# Patient Record
Sex: Female | Born: 1980 | Race: Black or African American | Hispanic: No | Marital: Single | Smoking: Former smoker
Health system: Southern US, Community
[De-identification: ages and names within clinical notes are randomized; demographics above are authoritative.]

## PROBLEM LIST (undated history)

## (undated) DIAGNOSIS — T7840XA Allergy, unspecified, initial encounter: Secondary | ICD-10-CM

## (undated) DIAGNOSIS — R102 Pelvic and perineal pain: Secondary | ICD-10-CM

## (undated) DIAGNOSIS — Z9889 Other specified postprocedural states: Secondary | ICD-10-CM

## (undated) DIAGNOSIS — F329 Major depressive disorder, single episode, unspecified: Secondary | ICD-10-CM

## (undated) DIAGNOSIS — D563 Thalassemia minor: Secondary | ICD-10-CM

## (undated) DIAGNOSIS — R112 Nausea with vomiting, unspecified: Secondary | ICD-10-CM

## (undated) DIAGNOSIS — Z973 Presence of spectacles and contact lenses: Secondary | ICD-10-CM

## (undated) DIAGNOSIS — F419 Anxiety disorder, unspecified: Secondary | ICD-10-CM

## (undated) DIAGNOSIS — Z87410 Personal history of cervical dysplasia: Secondary | ICD-10-CM

## (undated) DIAGNOSIS — B009 Herpesviral infection, unspecified: Secondary | ICD-10-CM

## (undated) DIAGNOSIS — F32A Depression, unspecified: Secondary | ICD-10-CM

## (undated) HISTORY — DX: Anxiety disorder, unspecified: F41.9

## (undated) HISTORY — DX: Allergy, unspecified, initial encounter: T78.40XA

## (undated) HISTORY — PX: ABDOMINAL HYSTERECTOMY: SHX81

## (undated) SURGERY — Surgical Case
Anesthesia: *Unknown

---
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
## 1999-02-08 ENCOUNTER — Other Ambulatory Visit: Admission: RE | Admit: 1999-02-08 | Discharge: 1999-02-08 | Payer: Self-pay | Admitting: Obstetrics and Gynecology

## 1999-06-06 ENCOUNTER — Other Ambulatory Visit: Admission: RE | Admit: 1999-06-06 | Discharge: 1999-06-06 | Payer: Self-pay | Admitting: Obstetrics & Gynecology

## 1999-11-07 ENCOUNTER — Other Ambulatory Visit: Admission: RE | Admit: 1999-11-07 | Discharge: 1999-11-07 | Payer: Self-pay | Admitting: Obstetrics & Gynecology

## 2000-05-28 ENCOUNTER — Other Ambulatory Visit: Admission: RE | Admit: 2000-05-28 | Discharge: 2000-05-28 | Payer: Self-pay | Admitting: Obstetrics & Gynecology

## 2000-11-03 HISTORY — PX: LAPAROSCOPY: SHX197

## 2000-11-11 ENCOUNTER — Ambulatory Visit (HOSPITAL_COMMUNITY): Admission: RE | Admit: 2000-11-11 | Discharge: 2000-11-11 | Payer: Self-pay | Admitting: Obstetrics and Gynecology

## 2001-11-16 ENCOUNTER — Other Ambulatory Visit: Admission: RE | Admit: 2001-11-16 | Discharge: 2001-11-16 | Payer: Self-pay | Admitting: Obstetrics and Gynecology

## 2011-12-15 ENCOUNTER — Telehealth: Payer: Self-pay | Admitting: Hematology and Oncology

## 2011-12-15 NOTE — Telephone Encounter (Signed)
lmonvm for pt re calling me for appt w/LO 

## 2011-12-16 ENCOUNTER — Telehealth: Payer: Self-pay | Admitting: Hematology and Oncology

## 2011-12-16 NOTE — Telephone Encounter (Signed)
Pt returned my call and was given new pt appt for 2/15 @ 9:30 am w/Elizabeth Benson.

## 2011-12-16 NOTE — Telephone Encounter (Signed)
lmonvm for pt re calling me for appt w/LO 

## 2011-12-17 ENCOUNTER — Telehealth: Payer: Self-pay | Admitting: Hematology and Oncology

## 2011-12-17 NOTE — Telephone Encounter (Signed)
Referred by Dr. Dorothyann Peng Dx- Abn Labs

## 2011-12-19 ENCOUNTER — Telehealth: Payer: Self-pay | Admitting: Hematology and Oncology

## 2011-12-19 ENCOUNTER — Ambulatory Visit (HOSPITAL_BASED_OUTPATIENT_CLINIC_OR_DEPARTMENT_OTHER): Payer: PRIVATE HEALTH INSURANCE | Admitting: Hematology and Oncology

## 2011-12-19 ENCOUNTER — Ambulatory Visit: Payer: PRIVATE HEALTH INSURANCE | Admitting: Lab

## 2011-12-19 ENCOUNTER — Ambulatory Visit: Payer: PRIVATE HEALTH INSURANCE

## 2011-12-19 ENCOUNTER — Encounter: Payer: Self-pay | Admitting: Hematology and Oncology

## 2011-12-19 DIAGNOSIS — D649 Anemia, unspecified: Secondary | ICD-10-CM

## 2011-12-19 DIAGNOSIS — D563 Thalassemia minor: Secondary | ICD-10-CM

## 2011-12-19 LAB — CBC WITH DIFFERENTIAL/PLATELET
BASO%: 0.5 % (ref 0.0–2.0)
LYMPH%: 43.7 % (ref 14.0–49.7)
MCH: 23.1 pg — ABNORMAL LOW (ref 25.1–34.0)
MCHC: 32.3 g/dL (ref 31.5–36.0)
MCV: 71.7 fL — ABNORMAL LOW (ref 79.5–101.0)
MONO%: 5.6 % (ref 0.0–14.0)
Platelets: 308 10*3/uL (ref 145–400)
RBC: 5.28 10*6/uL (ref 3.70–5.45)

## 2011-12-19 LAB — FERRITIN: Ferritin: 38 ng/mL (ref 10–291)

## 2011-12-19 NOTE — Telephone Encounter (Signed)
gv pt appt for [id number] 

## 2011-12-19 NOTE — Progress Notes (Signed)
This office note has been dictated.

## 2011-12-19 NOTE — Progress Notes (Signed)
CC:   [name], M.D.  IDENTIFYING STATEMENT:  The patient is a 31 year old woman seen at the request of Dr. Allyne Gee with abnormal labs.  HISTORY OF PRESENT ILLNESS:  The patient was undergoing a routine physical.  However during the visit she complained of bilateral lower extremity discomfort, worse with ambulation.  There was no history of recent trauma.  The patient stated that she sometimes had difficulty with getting up out of the chair.  She is also known to have history of endometriosis.  She had blood work performed by Dr. Zella Ball office on 11/10/2011 and results are as follows.  White cell count 5.9, hemoglobin 11.6, hematocrit 35.5, platelets 302, MCV low at 17.  Hemoglobin electrophoresis had shown hemoglobin A of 89.1, hemoglobin A2 4.7% and hemoglobin S 6.2%.  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  Ferritin level was 45.  MEDICATIONS:  Vitamin D 50,000 units twice a week, Vicodin 1 tablet every 4 hours as needed for pain.  ALLERGIES:  Tramadol.  SOCIAL HISTORY:  The patient is single with 2 children.  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  Denies alcohol or tobacco use.  FAMILY HISTORY:  Negative for sickle cell disease.  She does not know about thalassemia.  Maternal grandfather had prostate cancer.  Negative for hematological disorders.  REVIEW OF SYSTEMS:  Denies fever, chills, night sweats, anorexia, weight loss.  Currently denies pain.  Cardiovascular:  Denies chest pain, PND, orthopnea, ankle swelling.  Respirations:  Denies cough, hemoptysis, wheeze, shortness of breath.  Neurologic:  Denies headache, vision change, extremity weakness.  Skin:  No bruising or bleeding. Musculoskeletal:  Notes lower extremity pain, but currently not present. Rest of review of systems negative.  PHYSICAL EXAM:  Patient is alert and oriented x3.  ECOG 0.  Vitals: Pulse 72, blood pressure 111/69,  temperature 97, respirations 20, weight 157 pounds.  HEENT:  Head is atraumatic, normocephalic.  Sclerae anicteric.  Mouth moist.  Neck:  Supple.  Chest:  Clear to percussion and auscultation.  Cardiovascular:  1st and 2nd heart sounds were present.  No added sounds or murmurs.  Abdomen:  Soft, nontender.  No masses.  No hepatomegaly.  Bowel sounds present.  Extremities:  No edema.  Pulses were present and symmetric.  CNS:  Nonfocal.  IMPRESSION AND PLAN:  Ms. Delira is a 31 year old woman recently diagnosed with beta thalassemia minor.  She is also known to have low vitamin D levels.  She is here with the mother.  I spent more than half the time reviewing pathophysiology and potential treatment.  In general, the patient was told that most patients that carry the trait were not symptomatic and treatment was not required. However, I note that she is slightly anemic.  She had ferritin levels drawn by Dr. Allyne Gee office a while back, and there were mildly low.  I would like to have her repeat an anemia panel and also repeat a CBC, B12, vitamin D levels before she is discharged from clinic.  She follows up to discuss results.    ______________________________ Laurice Record, M.D. LIO/MEDQ  D:  12/19/2011  T:  12/19/2011  Job:  [id number]

## 2011-12-20 LAB — IRON AND TIBC
%SAT: 15 % — ABNORMAL LOW (ref 20–55)
Iron: 55 ug/dL (ref 42–145)

## 2011-12-20 LAB — COMPREHENSIVE METABOLIC PANEL
AST: 15 U/L (ref 0–37)
Albumin: 4.4 g/dL (ref 3.5–5.2)
Alkaline Phosphatase: 62 U/L (ref 39–117)
BUN: 9 mg/dL (ref 6–23)
Calcium: 9.1 mg/dL (ref 8.4–10.5)
Chloride: 103 mEq/L (ref 96–112)
Glucose, Bld: 77 mg/dL (ref 70–99)
Potassium: 4 mEq/L (ref 3.5–5.3)
Sodium: 138 mEq/L (ref 135–145)
Total Protein: 8 g/dL (ref 6.0–8.3)

## 2011-12-20 LAB — VITAMIN D 25 HYDROXY (VIT D DEFICIENCY, FRACTURES): Vit D, 25-Hydroxy: 37 ng/mL (ref 30–89)

## 2011-12-31 DIAGNOSIS — F419 Anxiety disorder, unspecified: Secondary | ICD-10-CM

## 2011-12-31 DIAGNOSIS — N809 Endometriosis, unspecified: Secondary | ICD-10-CM

## 2012-01-08 ENCOUNTER — Ambulatory Visit (HOSPITAL_BASED_OUTPATIENT_CLINIC_OR_DEPARTMENT_OTHER): Payer: PRIVATE HEALTH INSURANCE | Admitting: Hematology and Oncology

## 2012-01-08 ENCOUNTER — Telehealth: Payer: Self-pay | Admitting: Hematology and Oncology

## 2012-01-08 ENCOUNTER — Encounter: Payer: Self-pay | Admitting: Hematology and Oncology

## 2012-01-08 DIAGNOSIS — D563 Thalassemia minor: Secondary | ICD-10-CM | POA: Insufficient documentation

## 2012-01-08 MED ORDER — FE FUM-VIT C-VIT B12-FA 460-60-0.01-1 MG PO CAPS: 1.0000 | ORAL_CAPSULE | Freq: Every day | ORAL | Status: DC

## 2012-01-08 NOTE — Progress Notes (Signed)
Addended by: Normajean Baxter LE on: 01/08/2012 12:19 PM   Modules accepted: Orders

## 2012-01-08 NOTE — Telephone Encounter (Signed)
appts made and printed for pt aom °

## 2012-01-08 NOTE — Progress Notes (Signed)
This office note has been dictated.

## 2012-01-08 NOTE — Patient Instructions (Signed)
Patient to follow up as instructed.  

## 2012-01-08 NOTE — Progress Notes (Signed)
CC:   Elizabeth Benson, M.D.  IDENTIFYING PATIENT:  The patient is a 31 year old woman who presents discuss results.  INTERIM HISTORY:  In summary, the patient was found to have blood work that was consistent with a beta thalassemia minor.  She was referred to Hematology for further evaluation.  At that visit she complained of on ongoing bilateral lower extremity discomfort which she felt was worse with ambulation and worse at night.  She has not lost any weight.  She has had a history of heavy menses with endometriosis.  Workup results are as follows.  On 12/19/2011, white cell count 4.8, hemoglobin 12.2, hematocrit 37.9, platelets 308, MCV 71.7; peripheral smear noted microcytic red blood cells with mild polychromasia and few target cells with some basophilic stippling.  Vitamin B12 normal at 455, serum folate normal at 18.3, ferritin 38, iron 55, TIBC 379, saturation 15%.  MEDICATIONS:  Reviewed and updated.  Past medical history, family history and social history unchanged.  REVIEW OF SYSTEMS:  Besides intermittent leg pain, review of systems negative.  PHYSICAL EXAM:  Patient is alert and oriented x3.  Vitals:  Pulse 60, blood pressure 108/63, temperature 97.3, respirations 20, weight 168 pounds.  HEENT:  Head is atraumatic, normocephalic.  Sclerae anicteric. Mouth moist.  Neck:  Supple.  Chest:  Clear.  CVS:  Unremarkable. Abdomen:  Soft, nontender.  Bowel sounds present.  Extremities:  No edema.  LAB DATA:  As above.  In addition, sodium 138, potassium 4, chloride 103, CO2 25, a BUN 9, creatinine 0.75, glucose 77.  Total bilirubin 0.8, alkaline phosphatase 62, AST 15, ALT 11, calcium 9.1, vitamin D 37.  IMPRESSION AND PLAN:  Elizabeth Benson is a 31 year old woman with beta thalassemia minor.  She is asymptomatic.  She has trace iron-deficiency and I think she will benefit from oral iron.  We will prescribe Hematogen forte as this is well tolerated for 3 months.  Otherwise, she was told that beta thalassemia minor does not typically require treatment. However I did readdress the pathophysiology.  I also recommend she return to Dr. Allyne Benson for further evaluation of her lower extremity leg pain to rule out fibromyalgia versus restless leg syndrome.  She follows up in 3 months' time.    ______________________________ Laurice Record, M.D. LIO/MEDQ  D:  01/08/2012  T:  01/08/2012  Job:  [id number]

## 2012-03-22 ENCOUNTER — Encounter: Payer: Self-pay | Admitting: Obstetrics and Gynecology

## 2012-03-22 ENCOUNTER — Ambulatory Visit (INDEPENDENT_AMBULATORY_CARE_PROVIDER_SITE_OTHER): Payer: PRIVATE HEALTH INSURANCE | Admitting: Obstetrics and Gynecology

## 2012-03-22 VITALS — BP 108/64 | HR 70 | Ht 60.0 in | Wt 161.0 lb

## 2012-03-22 DIAGNOSIS — F419 Anxiety disorder, unspecified: Secondary | ICD-10-CM | POA: Insufficient documentation

## 2012-03-22 DIAGNOSIS — N809 Endometriosis, unspecified: Secondary | ICD-10-CM | POA: Insufficient documentation

## 2012-03-22 DIAGNOSIS — Z01419 Encounter for gynecological examination (general) (routine) without abnormal findings: Secondary | ICD-10-CM

## 2012-03-22 DIAGNOSIS — Z124 Encounter for screening for malignant neoplasm of cervix: Secondary | ICD-10-CM

## 2012-03-22 NOTE — Progress Notes (Signed)
Regular Periods: yes Mammogram: no  Monthly Breast Ex.: yes Exercise: no  Tetanus < 10 years: yes Seatbelts: yes  NI. Bladder Functn.: yes Abuse at home: no  Daily BM's: yes Stressful Work: yes  Healthy Diet: yes Sigmoid-Colonoscopy: no  Calcium: no Medical problems this year: endometriosis   LAST PAP:2012 nl  Contraception: no  Mammogram:  no  PCP: DR. SANDERS  PMH: NO CHANGE  FMH: NO CHANGE  Last Bone Scan: NO

## 2012-03-22 NOTE — Progress Notes (Signed)
Subjective:    Elizabeth Benson is a 31 y.o. female, [phone], who presents for an annual exam. The patient reports continuing pelvic pain assumed from endometriosis.  Has daily pain with the maximum pain rated 7/10 on  a 10 point pain scale. Has used Depo Lupron and continuous BCPs in the past with good results.  Has been resistant to trying the Aygestin because of fear of hirsutism.  Generally patient doesn't use analgesia, instead will endure the discomfort.  Menstrual cycle:   LMP: Patient's last menstrual period was 03/05/2012. Patient's flow is 7 days long with pad change every 2 hours;  has intense cramps but rarely takes medication              Review of Systems Pertinent items are noted in HPI. Denies pelvic pain, uti symptoms, vaginitis symptoms, irregular bleeding, menopausal symptoms, change in bowel habits or rectal bleeding   Objective:    BP 108/64  Pulse 70  Ht 5' (1.524 m)  Wt 161 lb (73.029 kg)  BMI 31.44 kg/m2  LMP 03/05/2012   @Weigh @t :  Wt Readings from Last 1 Encounters:  03/22/12 161 lb (73.029 kg)   BMI: Body mass index is 31.44 kg/(m^2). General Appearance: Alert, appropriate appearance for age. No acute distress HEENT: Grossly normal Neck / Thyroid: Supple, no thyromegaly or cervical adenopathy Lungs: clear to auscultation bilaterally Back: No CVA tenderness Breast Exam: No masses or nodes.No dimpling, nipple retraction or discharge. Cardiovascular: Regular rate and rhythm.  Gastrointestinal: Soft, tenderness without guarding right of midline inferiorly, no masses or organomegaly Pelvic Exam: EGBUS-wnl, vagina-normal rugae, cervix- without lesions or tenderness, uterus appears normal size shape and consistency, tender; adnexae-no masses with right adnexal tenderness Lymphatic Exam: Non-palpable nodes in neck, clavicular,  axillary, or inguinal regions  Skin: no rashes or abnormalities Extremities: no clubbing cyanosis or edema  Neurologic: grossly  normal Psychiatric: Alert and oriented, appropriate affect.     Assessment:   Routine GYN Exam Endometriosis Chronic Pelvic Pain   Plan:    PAP sent RTO 1 year or prn  Alaria Oconnor,ELMIRAPA-C

## 2012-04-13 ENCOUNTER — Other Ambulatory Visit (HOSPITAL_BASED_OUTPATIENT_CLINIC_OR_DEPARTMENT_OTHER): Payer: PRIVATE HEALTH INSURANCE | Admitting: Lab

## 2012-04-13 DIAGNOSIS — D563 Thalassemia minor: Secondary | ICD-10-CM

## 2012-04-13 LAB — CBC WITH DIFFERENTIAL/PLATELET
Basophils Absolute: 0 10*3/uL (ref 0.0–0.1)
EOS%: 3 % (ref 0.0–7.0)
Eosinophils Absolute: 0.1 10*3/uL (ref 0.0–0.5)
HCT: 36.1 % (ref 34.8–46.6)
HGB: 11.8 g/dL (ref 11.6–15.9)
MCH: 23.2 pg — ABNORMAL LOW (ref 25.1–34.0)
MCV: 71.3 fL — ABNORMAL LOW (ref 79.5–101.0)
NEUT%: 58.3 % (ref 38.4–76.8)
lymph#: 1.5 10*3/uL (ref 0.9–3.3)

## 2012-04-13 LAB — BASIC METABOLIC PANEL
BUN: 9 mg/dL (ref 6–23)
Chloride: 104 mEq/L (ref 96–112)
Creatinine, Ser: 0.86 mg/dL (ref 0.50–1.10)
Glucose, Bld: 99 mg/dL (ref 70–99)

## 2012-04-13 LAB — IRON AND TIBC: UIBC: 321 ug/dL (ref 125–400)

## 2012-04-16 ENCOUNTER — Ambulatory Visit (HOSPITAL_BASED_OUTPATIENT_CLINIC_OR_DEPARTMENT_OTHER): Payer: PRIVATE HEALTH INSURANCE | Admitting: Hematology and Oncology

## 2012-04-16 ENCOUNTER — Encounter: Payer: Self-pay | Admitting: Hematology and Oncology

## 2012-04-16 ENCOUNTER — Ambulatory Visit: Payer: PRIVATE HEALTH INSURANCE | Admitting: Hematology and Oncology

## 2012-04-16 ENCOUNTER — Telehealth: Payer: Self-pay | Admitting: Hematology and Oncology

## 2012-04-16 DIAGNOSIS — D563 Thalassemia minor: Secondary | ICD-10-CM

## 2012-04-16 DIAGNOSIS — D509 Iron deficiency anemia, unspecified: Secondary | ICD-10-CM

## 2012-04-16 NOTE — Patient Instructions (Signed)
[facility]  [id number]  [facility] Discharge Instructions  RECOMMENDATIONS MADE BY THE CONSULTANT AND ANY TEST RESULTS WILL BE SENT TO YOUR REFERRING DOCTOR.   EXAM FINDINGS BY MD TODAY AND SIGNS AND SYMPTOMS TO REPORT TO CLINIC OR PRIMARY MD:   Your current list of medications are: Current Outpatient Prescriptions  Medication Sig Dispense Refill  . cholecalciferol (VITAMIN D) 1000 UNITS tablet Take 2,000 Units by mouth every other day.      . Fe Fum-Vit C-Vit B12-FA (HEMATOGEN FORTE) 460-60-0.01-1 MG CAPS Take 1 capsule by mouth daily.  90 capsule  1  . HYDROcodone-acetaminophen (VICODIN) 5-500 MG per tablet Take 1 tablet by mouth every 4 (four) hours as needed.      . chlorhexidine (PERIDEX) 0.12 % solution Swish and spit 15 mLs Daily.         INSTRUCTIONS GIVEN AND DISCUSSED:   SPECIAL INSTRUCTIONS/FOLLOW-UP:  See above.  I acknowledge that I have been informed and understand all the instructions given to me and received a copy. I do not have any more questions at this time, but understand that I may call the [facility] at [phone] during business hours should I have any further questions or need assistance in obtaining follow-up care.

## 2012-04-16 NOTE — Progress Notes (Signed)
CC:   [name], M.D.  IDENTIFYING STATEMENT:  The patient is a 31 year old woman with beta thalassemia minor, who presents for followup.  INTERVAL HISTORY:  The patient has mild iron-deficiency anemia.  She was placed on oral iron, but admits to not being compliant.  She, however, notes good energy levels.  Does not have any problems with bleeding. Her diet is somewhat well-balanced.  Has no pain.  Of note, she has had a history of heavy menses with endometriosis.  MEDICATIONS:  Reviewed and updated.  ALLERGIES:  Tramadol.  PHYSICAL EXAMINATION:  Patient is alert and oriented x3.  Vitals:  Pulse 60, blood pressure 102/63, temperature 99.5, respirations 18, weight 159.7 pounds.  HEENT:  Head is atraumatic, normocephalic.  Sclerae anicteric.  Mouth no thrush.  Chest:  Clear to percussion auscultation. CVS:  Unremarkable.  Abdomen:  Soft, nontender.  Bowel sounds present. Extremities:  No edema.  LABORATORY DATA:  04/13/2012:  White cell count 4.6, hemoglobin 11.8, hematocrit 36.1, platelets 263.  Iron 50, TIBC 371, saturation 13%. Ferritin 25 (38).  IMPRESSION AND PLAN:  Mrs. Flippo is a 31 year old woman with a beta thalassemia minor with mild iron-deficiency anemia.  She has had a history of heavy menses and endometriosis.  Was placed on oral iron, but has been noncompliant.  The patient will re-initiate and complete prescription iron that was prescribed and thereafter will begin over-the-counter low-dose iron.  She follows up in 6 months' time.    ______________________________ Laurice Record, M.D. LIO/MEDQ  D:  04/16/2012  T:  04/16/2012  Job:  [id number]

## 2012-04-16 NOTE — Telephone Encounter (Signed)
appts made and printed for pt aom °

## 2012-04-16 NOTE — Progress Notes (Signed)
This office note has been dictated.

## 2012-04-20 ENCOUNTER — Telehealth: Payer: Self-pay | Admitting: Obstetrics and Gynecology

## 2012-04-20 MED ORDER — FLUOXETINE HCL 20 MG PO CAPS: ORAL_CAPSULE | ORAL | Status: DC

## 2012-04-20 NOTE — Telephone Encounter (Signed)
Discussion with patient complaining of worsening PMS symptoms. Particularly moodiness, increased fatigue, insomnia, difficulty concentrating, and fluid retention to name a few.  Reviewed management options: herbal, hormonal and misc.  (Sarafem).  Patient would like to try Sarafem 20 mg qd days 14-28 of cycle. Staceyann Knouff, PA-C

## 2012-06-14 ENCOUNTER — Encounter: Payer: Self-pay | Admitting: Obstetrics and Gynecology

## 2012-06-14 ENCOUNTER — Ambulatory Visit (INDEPENDENT_AMBULATORY_CARE_PROVIDER_SITE_OTHER): Payer: PRIVATE HEALTH INSURANCE | Admitting: Obstetrics and Gynecology

## 2012-06-14 VITALS — BP 126/76 | Wt 162.0 lb

## 2012-06-14 DIAGNOSIS — R102 Pelvic and perineal pain: Secondary | ICD-10-CM

## 2012-06-14 DIAGNOSIS — N949 Unspecified condition associated with female genital organs and menstrual cycle: Secondary | ICD-10-CM

## 2012-06-14 NOTE — Progress Notes (Signed)
HISTORY OF PRESENT ILLNESS  [name] is a 31 y.o. year old female,[id number], who presents for a problem visit.  The patient has a history of endometriosis.  She has a history of pelvic pain.  Subjective:  The patient reports that her pain is becoming progressively worse.  She is tired of taking pain medications.  Objective:  BP 126/76  Wt 73.483 kg (162 lb)  LMP 05/20/2012   General: alert and no distress  Exam deferred.  Assessment:  Pelvic pain History of endometriosis  Plan:  We discussed management options.  The patient was to consider laparoscopy.  The procedure was discussed.  Return to office prn if symptoms worsen or fail to improve.   Elizabeth Benson M.D.  06/14/2012 9:19 AM Is is is

## 2012-06-30 ENCOUNTER — Other Ambulatory Visit: Payer: Self-pay | Admitting: Obstetrics and Gynecology

## 2012-07-01 ENCOUNTER — Telehealth: Payer: Self-pay | Admitting: Obstetrics and Gynecology

## 2012-07-01 NOTE — Telephone Encounter (Signed)
Diagnostic Laparoscopy Scheduled for 08/06/12 @ 9:30 with AVS. Medcost effective 07/27/11.  Plan pays 100 % after a $2,500 deductible. Patient is not in a pre-existing waiting period per Byrd Hesselbach. Ref # [id number] Merita Norton Pridgen

## 2012-07-22 ENCOUNTER — Encounter (HOSPITAL_COMMUNITY): Payer: Self-pay | Admitting: Pharmacist

## 2012-07-28 ENCOUNTER — Encounter (HOSPITAL_COMMUNITY): Payer: Self-pay

## 2012-07-28 ENCOUNTER — Encounter (HOSPITAL_COMMUNITY)
Admission: RE | Admit: 2012-07-28 | Discharge: 2012-07-28 | Disposition: A | Discharge status: Home / Self Care | Payer: PRIVATE HEALTH INSURANCE | Source: Ambulatory Visit | Attending: Obstetrics and Gynecology | Admitting: Obstetrics and Gynecology

## 2012-07-28 LAB — CBC
HCT: 35.4 % — ABNORMAL LOW (ref 36.0–46.0)
Hemoglobin: 11.5 g/dL — ABNORMAL LOW (ref 12.0–15.0)
MCH: 22.9 pg — ABNORMAL LOW (ref 26.0–34.0)
RBC: 5.03 MIL/uL (ref 3.87–5.11)

## 2012-07-28 NOTE — Patient Instructions (Addendum)
   Your procedure is scheduled on: Friday October 4th  Enter through the [employer] [facility] at:8am Pick up the phone at the desk and dial [phone] and inform us of your arrival.  Please call this number if you have any problems the morning of surgery: [phone]  Remember: Do not eat or drink anything after midnight on Thursday Please take your prozac in the morning on Friday with sips of water.  Do not wear jewelry, make-up, or FINGER nail polish No metal in your hair or on your body. Do not wear lotions, powders, perfumes. You may wear deodorant.  Please use your CHG wash as directed prior to surgery.  Do not shave anywhere for at least 12 hours prior to first CHG shower.  Do not bring valuables to the hospital.  Patients discharged on the day of surgery will not be allowed to drive home.

## 2012-08-05 NOTE — H&P (Signed)
NEW GYNECOLOGIC EXAMINATION  [name] is an 31 y.o. female, [phone], who is followed by the [facility]. She presents for a diagnostic laparoscopy because of increasing dysmenorrhea and pelvic pain.  The patient was diagnosed with biopsy-proven endometriosis in 2002.  She has been treated with Depo-Lupron and pain medication.  Her complains AVW:UJWJXBJYNW pain .     Obstetrical History:  Vaginal Deliveries at Term:      2 Preterm Vaginal Deliveries:      0 Cesarean Deliveries at Term:  0 Preterm Cesareans:                 0 Miscarriages:                            1 Abortions:                                  1    Past Medical History  Diagnosis Date  . Anxiety   . Thalassemia minor 01/08/2012  . Endometriosis   . Dysplasia of cervix, low grade (CIN 1)     Past Surgical History  Procedure Date  . Laparoscopy 2002    for endometriosis  . Wisdom tooth extraction     Family History  Problem Relation Age of Onset  . Hypertension Mother   . Hypertension Maternal Aunt   . Diabetes Maternal Aunt   . Hypertension Maternal Uncle   . Diabetes Maternal Uncle   . Diabetes Paternal Aunt   . Hypertension Paternal Aunt   . Cancer Paternal Aunt     breast  . Diabetes Paternal Uncle   . Hypertension Paternal Uncle   . Stroke Paternal Uncle   . Hypertension Maternal Grandmother   . Cancer Maternal Grandfather     prostate  . Hypertension Maternal Grandfather   . Stroke Maternal Grandfather   . Diabetes Paternal Grandmother   . Stroke Paternal Grandfather   . Hypertension Paternal Grandfather     Social History:  reports that she quit smoking about a year ago. Her smoking use included Cigarettes. She has a 2 pack-year smoking history. She has never used smokeless tobacco. She reports that she drinks alcohol. She reports that she does not use illicit drugs.  Allergies:  Allergies  Allergen Reactions  .  Tramadol Itching  . Vicodin (Hydrocodone-Acetaminophen) Itching    Pt states she is able to tolerate vicodin but causes itching, has a current RX    Medications: Sarafem, pain medication.  Review of Systems:  See history of present illness and gynecologic history. The patient has PMS.  Physical Examination:  There were no vitals taken for this visit. There is no height or weight on file to calculate BMI.  General: alert and no distress Resp: clear to auscultation bilaterally Cardio: regular rate and rhythm, S1, S2 normal, no murmur, click, rub or gallop GI: soft, non-tender; bowel sounds normal; no masses,  no organomegaly Breast: No masses, skin changes, or discharge  External genitalia: normal general appearance Vagina: No masses or lesions, relaxation: no Cervix: Nontender, No lesions Uterus: Normal size, shape, and consistency Adnexa: No masses, tender on the right Rectovaginal exam:Deferred  No results found for this or any previous visit (from the past 48 hour(s)).  Wet Prep: None  Urine Pregnancy Test: None  Assessment:  Increasing dysmenorrhea  Endometriosis  PMS   Plan:    The patient will undergo a diagnostic laparoscopy.  She understands the indications for her surgical procedure.  She accepts the risk of, but not limited to, anesthetic complications , bleeding, infections, and possible damage to surrounding organs.  She understands that no guarantees can be given concerning the total relief of her discomfort. The patient reports that most of her discomfort is on the right.  She understands and agrees that, if the right ovary is completely normal,   we will not remove the ovary on that side.  However, if there is scar tissue or endometriosis on the right ovary, Zenaida Niece the patient request that a right oophorectomy be performed.  Leonard Schwartz, M.D. 08/05/2012

## 2012-08-06 ENCOUNTER — Encounter (HOSPITAL_COMMUNITY): Payer: Self-pay | Admitting: Anesthesiology

## 2012-08-06 ENCOUNTER — Encounter (HOSPITAL_COMMUNITY)
Admission: RE | Disposition: A | Discharge status: Home / Self Care | Payer: Self-pay | Source: Ambulatory Visit | Attending: Obstetrics and Gynecology

## 2012-08-06 ENCOUNTER — Ambulatory Visit (HOSPITAL_COMMUNITY)
Admission: RE | Admit: 2012-08-06 | Discharge: 2012-08-06 | Disposition: A | Discharge status: Home / Self Care | Payer: PRIVATE HEALTH INSURANCE | Source: Ambulatory Visit | Attending: Obstetrics and Gynecology | Admitting: Obstetrics and Gynecology

## 2012-08-06 ENCOUNTER — Ambulatory Visit (HOSPITAL_COMMUNITY): Payer: PRIVATE HEALTH INSURANCE | Admitting: Anesthesiology

## 2012-08-06 DIAGNOSIS — N803 Endometriosis of pelvic peritoneum, unspecified: Secondary | ICD-10-CM | POA: Insufficient documentation

## 2012-08-06 DIAGNOSIS — N946 Dysmenorrhea, unspecified: Secondary | ICD-10-CM | POA: Insufficient documentation

## 2012-08-06 DIAGNOSIS — N809 Endometriosis, unspecified: Secondary | ICD-10-CM

## 2012-08-06 DIAGNOSIS — N949 Unspecified condition associated with female genital organs and menstrual cycle: Secondary | ICD-10-CM | POA: Insufficient documentation

## 2012-08-06 HISTORY — PX: LAPAROSCOPY: SHX197

## 2012-08-06 LAB — PREGNANCY, URINE: Preg Test, Ur: NEGATIVE

## 2012-08-06 SURGERY — LAPAROSCOPY OPERATIVE
Anesthesia: General | Site: Abdomen | Wound class: Clean Contaminated

## 2012-08-06 MED ORDER — INDIGOTINDISULFONATE SODIUM 8 MG/ML IJ SOLN
INTRAMUSCULAR | Status: AC
  Filled 2012-08-06: qty 5

## 2012-08-06 MED ORDER — PROMETHAZINE HCL 25 MG/ML IJ SOLN
INTRAMUSCULAR | Status: AC
  Administered 2012-08-06: 6.25 mg via INTRAVENOUS
  Filled 2012-08-06: qty 1

## 2012-08-06 MED ORDER — GLYCOPYRROLATE 0.2 MG/ML IJ SOLN
INTRAMUSCULAR | Status: AC
  Filled 2012-08-06: qty 2

## 2012-08-06 MED ORDER — MIDAZOLAM HCL 5 MG/5ML IJ SOLN
INTRAMUSCULAR | Status: DC | PRN
  Administered 2012-08-06: 2 mg via INTRAVENOUS

## 2012-08-06 MED ORDER — PROMETHAZINE HCL 12.5 MG PO TABS: 12.5000 mg | ORAL_TABLET | Freq: Four times a day (QID) | ORAL | Status: DC | PRN

## 2012-08-06 MED ORDER — MIDAZOLAM HCL 2 MG/2ML IJ SOLN
INTRAMUSCULAR | Status: AC
  Filled 2012-08-06: qty 2

## 2012-08-06 MED ORDER — DEXAMETHASONE SODIUM PHOSPHATE 4 MG/ML IJ SOLN
INTRAMUSCULAR | Status: DC | PRN
  Administered 2012-08-06: 10 mg via INTRAVENOUS

## 2012-08-06 MED ORDER — BUPIVACAINE-EPINEPHRINE (PF) 0.5% -1:200000 IJ SOLN
INTRAMUSCULAR | Status: AC
  Filled 2012-08-06: qty 20

## 2012-08-06 MED ORDER — GLYCOPYRROLATE 0.2 MG/ML IJ SOLN
INTRAMUSCULAR | Status: DC | PRN
  Administered 2012-08-06: 0.4 mg via INTRAVENOUS

## 2012-08-06 MED ORDER — NEOSTIGMINE METHYLSULFATE 1 MG/ML IJ SOLN
INTRAMUSCULAR | Status: AC
  Filled 2012-08-06: qty 10

## 2012-08-06 MED ORDER — BUPIVACAINE-EPINEPHRINE 0.5% -1:200000 IJ SOLN
INTRAMUSCULAR | Status: DC | PRN
  Administered 2012-08-06: 7 mL

## 2012-08-06 MED ORDER — NEOSTIGMINE METHYLSULFATE 1 MG/ML IJ SOLN
INTRAMUSCULAR | Status: DC | PRN
  Administered 2012-08-06: 3 mg via INTRAVENOUS

## 2012-08-06 MED ORDER — ONDANSETRON HCL 4 MG/2ML IJ SOLN
INTRAMUSCULAR | Status: AC
  Filled 2012-08-06: qty 2

## 2012-08-06 MED ORDER — DEXAMETHASONE SODIUM PHOSPHATE 10 MG/ML IJ SOLN
INTRAMUSCULAR | Status: AC
Start: 2012-08-06 — End: 2012-08-06
  Filled 2012-08-06: qty 1

## 2012-08-06 MED ORDER — FENTANYL CITRATE 0.05 MG/ML IJ SOLN
INTRAMUSCULAR | Status: AC
  Filled 2012-08-06: qty 2

## 2012-08-06 MED ORDER — FENTANYL CITRATE 0.05 MG/ML IJ SOLN
25.0000 ug | INTRAMUSCULAR | Status: DC | PRN
  Administered 2012-08-06 (×2): 25 ug via INTRAVENOUS

## 2012-08-06 MED ORDER — PROPOFOL 10 MG/ML IV EMUL
INTRAVENOUS | Status: AC
  Filled 2012-08-06: qty 20

## 2012-08-06 MED ORDER — LIDOCAINE HCL (CARDIAC) 20 MG/ML IV SOLN
INTRAVENOUS | Status: DC | PRN
  Administered 2012-08-06: 30 mg via INTRAVENOUS
  Administered 2012-08-06: 20 mg via INTRAVENOUS
  Administered 2012-08-06: 30 mg via INTRAVENOUS

## 2012-08-06 MED ORDER — FENTANYL CITRATE 0.05 MG/ML IJ SOLN
INTRAMUSCULAR | Status: AC
  Administered 2012-08-06: 25 ug via INTRAVENOUS
  Filled 2012-08-06: qty 2

## 2012-08-06 MED ORDER — PROPOFOL 10 MG/ML IV EMUL
INTRAVENOUS | Status: DC | PRN
  Administered 2012-08-06: 160 mg via INTRAVENOUS

## 2012-08-06 MED ORDER — MIDAZOLAM HCL 2 MG/2ML IJ SOLN: 0.5000 mg | Freq: Once | INTRAMUSCULAR | Status: DC | PRN

## 2012-08-06 MED ORDER — HYDROCODONE-ACETAMINOPHEN 5-500 MG PO TABS: 1.0000 | ORAL_TABLET | ORAL | Status: DC | PRN

## 2012-08-06 MED ORDER — FLUMAZENIL 1 MG/10ML IV SOLN
INTRAVENOUS | Status: AC
  Filled 2012-08-06: qty 10

## 2012-08-06 MED ORDER — ROCURONIUM BROMIDE 100 MG/10ML IV SOLN
INTRAVENOUS | Status: DC | PRN
  Administered 2012-08-06: 30 mg via INTRAVENOUS

## 2012-08-06 MED ORDER — LACTATED RINGERS IV SOLN
INTRAVENOUS | Status: DC
  Administered 2012-08-06 (×2): via INTRAVENOUS

## 2012-08-06 MED ORDER — KETOROLAC TROMETHAMINE 30 MG/ML IJ SOLN
INTRAMUSCULAR | Status: DC | PRN
  Administered 2012-08-06: 60 mg via INTRAVENOUS

## 2012-08-06 MED ORDER — KETOROLAC TROMETHAMINE 60 MG/2ML IM SOLN
INTRAMUSCULAR | Status: AC
  Filled 2012-08-06: qty 2

## 2012-08-06 MED ORDER — LIDOCAINE HCL (CARDIAC) 20 MG/ML IV SOLN
INTRAVENOUS | Status: AC
  Filled 2012-08-06: qty 5

## 2012-08-06 MED ORDER — ROCURONIUM BROMIDE 50 MG/5ML IV SOLN
INTRAVENOUS | Status: AC
  Filled 2012-08-06: qty 1

## 2012-08-06 MED ORDER — PROPOFOL 10 MG/ML IV EMUL: INTRAVENOUS | Status: DC | PRN

## 2012-08-06 MED ORDER — FENTANYL CITRATE 0.05 MG/ML IJ SOLN
INTRAMUSCULAR | Status: DC | PRN
  Administered 2012-08-06 (×2): 50 ug via INTRAVENOUS
  Administered 2012-08-06: 100 ug via INTRAVENOUS

## 2012-08-06 MED ORDER — FLUMAZENIL 0.5 MG/5ML IV SOLN
INTRAVENOUS | Status: DC | PRN
  Administered 2012-08-06: 0.2 mg via INTRAVENOUS

## 2012-08-06 MED ORDER — PROMETHAZINE HCL 25 MG/ML IJ SOLN
6.2500 mg | INTRAMUSCULAR | Status: DC | PRN
  Administered 2012-08-06: 6.25 mg via INTRAVENOUS

## 2012-08-06 MED ORDER — KETOROLAC TROMETHAMINE 30 MG/ML IJ SOLN: 15.0000 mg | Freq: Once | INTRAMUSCULAR | Status: DC | PRN

## 2012-08-06 MED ORDER — MEPERIDINE HCL 25 MG/ML IJ SOLN: 6.2500 mg | INTRAMUSCULAR | Status: DC | PRN

## 2012-08-06 MED ORDER — ONDANSETRON HCL 4 MG/2ML IJ SOLN
INTRAMUSCULAR | Status: DC | PRN
  Administered 2012-08-06: 4 mg via INTRAVENOUS

## 2012-08-06 MED ORDER — IBUPROFEN 800 MG PO TABS: 800.0000 mg | ORAL_TABLET | Freq: Three times a day (TID) | ORAL | Status: DC | PRN

## 2012-08-06 SURGICAL SUPPLY — 26 items
BAG SPEC RTRVL LRG 6X4 10 (ENDOMECHANICALS)
CABLE HIGH FREQUENCY MONO STRZ (ELECTRODE) IMPLANT
CHLORAPREP W/TINT 26ML (MISCELLANEOUS) ×2 IMPLANT
CLOTH BEACON ORANGE TIMEOUT ST (SAFETY) ×2 IMPLANT
FORCEPS CUTTING 33CM 5MM (CUTTING FORCEPS) IMPLANT
FORCEPS CUTTING 45CM 5MM (CUTTING FORCEPS) IMPLANT
GLOVE BIOGEL PI IND STRL 8.5 (GLOVE) ×1 IMPLANT
GLOVE BIOGEL PI INDICATOR 8.5 (GLOVE) ×1
GLOVE ECLIPSE 8.0 STRL XLNG CF (GLOVE) ×4 IMPLANT
GOWN PREVENTION PLUS LG XLONG (DISPOSABLE) ×4 IMPLANT
GOWN PREVENTION PLUS XXLARGE (GOWN DISPOSABLE) ×2 IMPLANT
NS IRRIG 1000ML POUR BTL (IV SOLUTION) ×2 IMPLANT
PACK LAPAROSCOPY BASIN (CUSTOM PROCEDURE TRAY) ×2 IMPLANT
POUCH SPECIMEN RETRIEVAL 10MM (ENDOMECHANICALS) IMPLANT
PROTECTOR NERVE ULNAR (MISCELLANEOUS) ×2 IMPLANT
RINGERS IRRIG 1000ML POUR BTL (IV SOLUTION) IMPLANT
SCALPEL HARMONIC ACE (MISCELLANEOUS) IMPLANT
SET IRRIG TUBING LAPAROSCOPIC (IRRIGATION / IRRIGATOR) IMPLANT
STRIP CLOSURE SKIN 1/4X3 (GAUZE/BANDAGES/DRESSINGS) ×2 IMPLANT
SUT MNCRL AB 4-0 PS2 18 (SUTURE) ×2 IMPLANT
SUT VIC AB 2-0 UR6 27 (SUTURE) IMPLANT
SUT VICRYL 0 ENDOLOOP (SUTURE) IMPLANT
SYR 50ML LL SCALE MARK (SYRINGE) ×1 IMPLANT
TOWEL OR 17X24 6PK STRL BLUE (TOWEL DISPOSABLE) ×4 IMPLANT
TRAY FOLEY CATH 14FR (SET/KITS/TRAYS/PACK) ×2 IMPLANT
WATER STERILE IRR 1000ML POUR (IV SOLUTION) ×1 IMPLANT

## 2012-08-06 NOTE — Discharge Instructions (Signed)
Diagnostic Laparoscopy  Laparoscopy is a surgical procedure. It is used to diagnose and treat diseases inside the belly(abdomen). It is usually a brief, common, and relatively simple procedure. The laparoscopeis a thin, lighted, pencil-sized instrument. It is like a telescope. It is inserted into your abdomen through a small cut (incision). Your caregiver can look at the organs inside your body through this instrument. He or she can see if there is anything abnormal.  Laparoscopy can be done either in a hospital or outpatient clinic. You may be given a mild sedative to help you relax before the procedure. Once in the operating room, you will be given a drug to make you sleep (general anesthesia). Laparoscopy usually lasts less than 1 hour. After the procedure, you will be monitored in a recovery area until you are stable and doing well. Once you are home, it will take 2 to 3 days to fully recover.  RISKS AND COMPLICATIONS   Laparoscopy has relatively few risks. Your caregiver will discuss the risks with you before the procedure.  Some problems that can occur include:  · Infection.  · Bleeding.  · Damage to other organs.  · Anesthetic side effects.  PROCEDURE  Once you receive anesthesia, your surgeon inflates the abdomen with a harmless gas (carbon dioxide). This makes the organs easier to see. The laparoscope is inserted into the abdomen through a small incision. This allows your surgeon to see into the abdomen. Other small instruments are also inserted into the abdomen through other small openings. Many surgeons attach a video camera to the laparoscope to enlarge the view.  During a diagnostic laparoscopy, the surgeon may be looking for inflammation, infection, or cancer. Your surgeon may take tissue samples(biopsies). The samples are sent to a specialist in looking at cells and tissue samples (pathologist). The pathologist examines them under a microscope. Biopsies can help to diagnose or confirm a  disease.  AFTER THE PROCEDURE   · The gas is released from inside the abdomen.  · The incisions are closed with stitches (sutures). Because these incisions are small (usually less than 1/2 inch), there is usually minimal discomfort after the procedure. There may be some mild discomfort in the throat. This is from the tube placed in the throat while you were sleeping. You may have some mild abdominal discomfort. There may also be discomfort from the instrument placement incisions in the abdomen.  · The recovery time is shortened as long as there are no complications.  · You will rest in a recovery room until stable and doing well. As long as there are no complications, you may be allowed to go home.  FINDING OUT THE RESULTS OF YOUR TEST  Not all test results are available during your visit. If your test results are not back during the visit, make an appointment with your caregiver to find out the results. Do not assume everything is normal if you have not heard from your caregiver or the medical facility. It is important for you to follow up on all of your test results.  HOME CARE INSTRUCTIONS   · Take all medicines as directed.  · Only take over-the-counter or prescription medicines for pain, discomfort, or fever as directed by your caregiver.  · Resume daily activities as directed.  · Showers are preferred over baths.  · You may resume sexual activities in 1 week or as directed.  · Do not drive while taking narcotics.  SEEK MEDICAL CARE IF:   · There is increasing   abdominal pain.  · There is new pain in the shoulders (shoulder strap areas).  · You feel lightheaded or faint.  · You have the chills.  · You or your child has an oral temperature above 102° F (38.9° C).  · There is pus-like (purulent) drainage from any of the wounds.  · You are unable to pass gas or have a bowel movement.  · You feel sick to your stomach (nauseous) or throw up (vomit).  MAKE SURE YOU:   · Understand these instructions.  · Will watch  your condition.  · Will get help right away if you are not doing well or get worse.  Document Released: 01/26/2001 Document Revised: 01/12/2012 Document Reviewed: 10/20/2007  [employer]® Patient Information ©2013 [employer].

## 2012-08-06 NOTE — Transfer of Care (Signed)
Immediate Anesthesia Transfer of Care Note  Patient: Elizabeth Benson  Procedure(s) Performed: Procedure(s) (LRB) with comments: LAPAROSCOPY OPERATIVE (N/A) - Laparoscopic Pelvic Biopsies ABLATION ON ENDOMETRIOSIS (N/A)  Patient Location: PACU  Anesthesia Type: General  Level of Consciousness: awake, sedated, patient cooperative and lethargic  Airway & Oxygen Therapy: Patient Spontanous Breathing and Patient connected to nasal cannula oxygen  Post-op Assessment: Report given to PACU RN and Post -op Vital signs reviewed and stable  Post vital signs: Reviewed and stable  Complications: No apparent anesthesia complications

## 2012-08-06 NOTE — OR Nursing (Signed)
Procedure "Operative Laparoscopy, Laparoscopic pelvic biopsies, ablation of endometriosis"

## 2012-08-06 NOTE — Op Note (Addendum)
OPERATIVE NOTE  FEMALE IAFRATE  DOB:    1981-07-24  MRN:    [id number]  CSN:    [id number]  Date of Surgery:  08/06/2012  Preoperative Diagnosis:  Increasing dysmenorrhea  Endometriosis  Postoperative Diagnosis:  Increasing dysmenorrhea  Stage I Endometriosis  Procedure:  Diagnostic laparoscopy  Laparoscopic pelvic biopsies  Laparoscopic ablation of endometriosis  Surgeon:  Leonard Schwartz, M.D.  Assistant:  None  Anesthetic:  General  Disposition:  The patient is a 31 year old female, para 2-0-2-2, who has a known history of endometriosis that was biopsy-proven in 2002. She complains of increasing dysmenorrhea. She has been treated with hormone therapy and Lupron therapy. She wishes to proceed with laparoscopy at this time. She understands the indications for her surgical procedure. She accepts the risk of, but not limited to, anesthetic complications, bleeding, infections, and possible damage to the surrounding organs.  Findings:  No masses were appreciated on examination under anesthesia. On laparoscopy, the uterus was noted to be normal. The ovaries and the fallopian tubes appear normal. The anterior cul-de-sac was free. There were several hyperpigmented lesions and red lesions in the posterior cul-de-sac mainly to the left of the midline. There was a hyperpigmented lesion on the peritoneum just to the left of the rectum. They all measured less than 0.3 cm. This appeared to be consistent with endometriosis. The were filmy adhesions noted between the bowel and the right peritoneal sidewall. The appendix and the bowel otherwise appeared normal. The liver and the upper abdomen appeared normal.  Procedure:  The patient was taken to the operating room where a general anesthetic was given. The patient's abdomen, perineum, and vagina were prepped with multiple layers of Betadine. A Foley catheter was placed in the bladder. Examination under anesthesia was  performed. A Hulka tenaculum was placed inside the uterus. The patient was sterilely draped. The subumbilical area was injected with 5 cc of half percent Marcaine with epinephrine. A subumbilical incision was made and the Verres needle was inserted into the abdominal cavity without difficulty. Proper placement was confirmed using the fluid test. A pneumoperitoneum was then obtained. The laparoscopic trocar and the laparoscope were substituted for the Veress needle. The pelvis and the bowel were carefully inspected. There was no evidence of damage. 3 cc of half percent Marcaine with epinephrine were injected suprapubically. A small incision was made and the 5 mm trocar was placed in the abdominal cavity under direct visualization. The pelvic structures were carefully inspected and pictures were taken. The hyperpigmented lesions were biopsied. A total of 3 biopsies were obtained. Care was taken not to damage any of the vital pelvic structures and not to damage the bowel. We then used the bipolar cautery to ablate the peritoneal surface where perhaps endometriosis was present. Again, care was taken not to damage any of the vital structures. Hemostasis was confirmed throughout. The suprapubic trocar was removed. The pneumoperitoneum was allowed to escape. The subumbilical fascia was closed using 0 Vicryl. All skin incisions were closed using 3-0 Monocryl. Sponge and needle counts were correct. The patient was awakened from her anesthetic without difficulty. She was transported to the recovery room in stable condition. 3 biopsies from the pelvis were sent to pathology.  Followup instructions:  The patient will return to see Dr. Stefano Gaul in 2 weeks. She was given a copy of the postoperative instructions as prepared by [facility] for patients who've undergone laparoscopy. The patient will call for questions or concerns.  Discharge medications:  Motrin 800 mg  every 8 hours as needed for mild to moderate pain.  Vicodin  5/500 one or 2 tablets every 4 hours as needed for severe pain.  Phenergan 12.5 mg one tablet every 6 hours as needed for nausea.  Leonard Schwartz, M.D.

## 2012-08-06 NOTE — H&P (Signed)
The patient was interviewed and examined today.  The previously documented history and physical examination was reviewed. There are no changes. The operative procedure was reviewed. The risks and benefits were outlined again. The specific risks include, but are not limited to, anesthetic complications, bleeding, infections, and possible damage to the surrounding organs. The patient's questions were answered.  We are ready to proceed as outlined. The likelihood of the patient achieving the goals of this procedure is very likely.   BP 112/66  Pulse 61  Temp 97.9 F (36.6 C) (Oral)  Resp 16  SpO2 100%  CBC    Component Value Date/Time   WBC 5.9 07/28/2012 1151   WBC 4.6 04/13/2012 0815   RBC 5.03 07/28/2012 1151   RBC 5.07 04/13/2012 0815   HGB 11.5* 07/28/2012 1151   HGB 11.8 04/13/2012 0815   HCT 35.4* 07/28/2012 1151   HCT 36.1 04/13/2012 0815   PLT 277 07/28/2012 1151   PLT 263 04/13/2012 0815   MCV 70.4* 07/28/2012 1151   MCV 71.3* 04/13/2012 0815   MCH 22.9* 07/28/2012 1151   MCH 23.2* 04/13/2012 0815   MCHC 32.5 07/28/2012 1151   MCHC 32.5 04/13/2012 0815   RDW 15.4 07/28/2012 1151   RDW 17.0* 04/13/2012 0815   LYMPHSABS 1.5 04/13/2012 0815   MONOABS 0.3 04/13/2012 0815   EOSABS 0.1 04/13/2012 0815   BASOSABS 0.0 04/13/2012 0815      Leonard Schwartz, M.D.

## 2012-08-06 NOTE — Anesthesia Preprocedure Evaluation (Signed)
Anesthesia Evaluation  Patient identified by MRN, date of birth, ID band Patient awake    Reviewed: Allergy & Precautions, H&P , Patient's Chart, lab work & pertinent test results, reviewed documented beta blocker date and time   History of Anesthesia Complications Negative for: history of anesthetic complications  Airway Mallampati: II TM Distance: >3 FB Neck ROM: full    Dental No notable dental hx.    Pulmonary neg pulmonary ROS,  breath sounds clear to auscultation  Pulmonary exam normal       Cardiovascular Exercise Tolerance: Good negative cardio ROS  Rhythm:regular Rate:Normal     Neuro/Psych PSYCHIATRIC DISORDERS negative neurological ROS  negative psych ROS   GI/Hepatic negative GI ROS, Neg liver ROS,   Endo/Other  negative endocrine ROS  Renal/GU negative Renal ROS     Musculoskeletal   Abdominal   Peds  Hematology negative hematology ROS (+)   Anesthesia Other Findings Anxiety     Thalassemia minor 01/08/2012      Endometriosis     Dysplasia of cervix, low grade (CIN 1)    Reproductive/Obstetrics negative OB ROS                           Anesthesia Physical Anesthesia Plan  ASA: II  Anesthesia Plan: General ETT   Post-op Pain Management:    Induction:   Airway Management Planned:   Additional Equipment:   Intra-op Plan:   Post-operative Plan:   Informed Consent: I have reviewed the patients History and Physical, chart, labs and discussed the procedure including the risks, benefits and alternatives for the proposed anesthesia with the patient or authorized representative who has indicated his/her understanding and acceptance.   Dental Advisory Given  Plan Discussed with: CRNA and Surgeon  Anesthesia Plan Comments:         Anesthesia Quick Evaluation

## 2012-08-06 NOTE — Anesthesia Postprocedure Evaluation (Signed)
Anesthesia Post Note  Patient: Elizabeth Benson  Procedure(s) Performed: Procedure(s) (LRB): LAPAROSCOPY OPERATIVE (N/A) ABLATION ON ENDOMETRIOSIS (N/A)  Anesthesia type: GA  Patient location: PACU  Post pain: Pain level controlled  Post assessment: Post-op Vital signs reviewed  Last Vitals:  Filed Vitals:   08/06/12 1115  BP: 94/60  Pulse: 54  Temp:   Resp: 16    Post vital signs: Reviewed  Level of consciousness: sedated  Complications: No apparent anesthesia complications

## 2012-08-09 ENCOUNTER — Encounter (HOSPITAL_COMMUNITY): Payer: Self-pay | Admitting: Obstetrics and Gynecology

## 2012-08-10 ENCOUNTER — Encounter: Payer: Self-pay | Admitting: Obstetrics and Gynecology

## 2012-08-10 ENCOUNTER — Ambulatory Visit (INDEPENDENT_AMBULATORY_CARE_PROVIDER_SITE_OTHER): Payer: PRIVATE HEALTH INSURANCE | Admitting: Obstetrics and Gynecology

## 2012-08-10 VITALS — BP 100/62 | Resp 14

## 2012-08-10 DIAGNOSIS — R42 Dizziness and giddiness: Secondary | ICD-10-CM

## 2012-08-10 LAB — BASIC METABOLIC PANEL
Calcium: 9.2 mg/dL (ref 8.4–10.5)
Creat: 0.78 mg/dL (ref 0.50–1.10)
Sodium: 139 mEq/L (ref 135–145)

## 2012-08-10 LAB — CBC
HCT: 34.1 % — ABNORMAL LOW (ref 36.0–46.0)
Hemoglobin: 11.3 g/dL — ABNORMAL LOW (ref 12.0–15.0)
MCHC: 33.1 g/dL (ref 30.0–36.0)
RDW: 16 % — ABNORMAL HIGH (ref 11.5–15.5)
WBC: 6.3 10*3/uL (ref 4.0–10.5)

## 2012-08-10 NOTE — Progress Notes (Signed)
HISTORY OF PRESENT ILLNESS  [name] is a 31 y.o. year old female,G4P0012, who presents for a postoperative visit. On August 06, 2012 the patient had a diagnostic laparoscopy with laparoscopic biopsies and laparoscopic ablation of endometriosis.  Path report confirmed endometriosis.  Subjective:  The patient complains of dizziness.  She was able to eat her breakfast.  She denies symptoms.  She has normal GI and GU function.  Objective:  BP 100/62  Resp 14  LMP 07/09/2012   General: alert and no distress Resp: clear to auscultation bilaterally Cardio: regular rate and rhythm, S1, S2 normal, no murmur, click, rub or gallop GI: Soft, nontender, healing incisions  Exam deferred.  Assessment:  Dizziness of uncertain etiology.  Biopsy confirmed pelvic endometriosis  Plan:  CBC and basic metabolic panel.  Patient will go home and rest.  She will call if she is not feeling better in the morning.  Return to office prn if symptoms worsen or fail to improve.   Leonard Schwartz M.D.  08/10/2012 12:27 PM

## 2012-08-23 ENCOUNTER — Ambulatory Visit (INDEPENDENT_AMBULATORY_CARE_PROVIDER_SITE_OTHER): Payer: PRIVATE HEALTH INSURANCE | Admitting: Obstetrics and Gynecology

## 2012-08-23 ENCOUNTER — Encounter: Payer: Self-pay | Admitting: Obstetrics and Gynecology

## 2012-08-23 VITALS — BP 112/62 | Ht 60.25 in | Wt 162.0 lb

## 2012-08-23 DIAGNOSIS — N809 Endometriosis, unspecified: Secondary | ICD-10-CM

## 2012-08-23 MED ORDER — MEDROXYPROGESTERONE ACETATE 150 MG/ML IM SUSP: 150.0000 mg | Freq: Once | INTRAMUSCULAR | Status: DC

## 2012-08-23 NOTE — Progress Notes (Signed)
HISTORY OF PRESENT ILLNESS  [name] is a 31 y.o. year old female,G4P0012, who presents for a problem visit. She had a diagnostic laparoscopy with biopsies and ablation of endometriosis on August 06, 2012.  Pathology report confirmed endometriosis.  She has a past history of the same.  She was treated with 6 months of Depo-Lupron in the past.  Subjective:  Feels much better.  Her dizziness has resolved  Objective:  BP 112/62  Ht 5' 0.25" (1.53 m)  Wt 162 lb (73.483 kg)  BMI 31.38 kg/m2  LMP 07/09/2012   General: no distress GI: nontender, healed incisions  External genitalia: normal general appearance Vaginal: lactation, no masses Cervix: nontender Adnexa: nontender, no masses Uterus: upper limits normal size, nontender  Assessment:  Recurrent endometriosis  Improve dysmenorrhea and pain  Plan:  Medical management of endometriosis was reviewed.  Options for care were outlined.  Risks and benefits were discussed.  The patient will begin Depo-Provera 150 mg every 12 weeks.  Return to office in 7 month(s) for annual exam.  Call sooner if her discomfort returned.   Leonard Schwartz M.D.  08/23/2012 2:12 PM    DATE OF SURGERY: 08/06/2012 TYPE OF SURGERY: Diagnostic Laparoscopy PAIN:No VAG BLEEDING: no VAG DISCHARGE: no NORMAL GI FUNCTN: yes NORMAL GU FUNCTN: yes

## 2012-08-26 ENCOUNTER — Other Ambulatory Visit (INDEPENDENT_AMBULATORY_CARE_PROVIDER_SITE_OTHER): Payer: PRIVATE HEALTH INSURANCE

## 2012-08-26 DIAGNOSIS — Z3009 Encounter for other general counseling and advice on contraception: Secondary | ICD-10-CM

## 2012-08-26 LAB — POCT URINE PREGNANCY: Preg Test, Ur: NEGATIVE

## 2012-08-26 MED ORDER — MEDROXYPROGESTERONE ACETATE 150 MG/ML IM SUSP
150.0000 mg | Freq: Once | INTRAMUSCULAR | Status: AC
  Administered 2012-08-26: 150 mg via INTRAMUSCULAR

## 2012-08-26 NOTE — Progress Notes (Unsigned)
Next Depo due-11-17-2012 

## 2012-09-27 ENCOUNTER — Encounter: Payer: PRIVATE HEALTH INSURANCE | Admitting: Obstetrics and Gynecology

## 2012-09-28 ENCOUNTER — Encounter: Payer: Self-pay | Admitting: Obstetrics and Gynecology

## 2012-09-28 ENCOUNTER — Other Ambulatory Visit: Payer: Self-pay | Admitting: Obstetrics and Gynecology

## 2012-09-28 ENCOUNTER — Ambulatory Visit (INDEPENDENT_AMBULATORY_CARE_PROVIDER_SITE_OTHER): Payer: PRIVATE HEALTH INSURANCE | Admitting: Obstetrics and Gynecology

## 2012-09-28 VITALS — BP 110/72 | Ht 60.0 in | Wt 159.0 lb

## 2012-09-28 DIAGNOSIS — R928 Other abnormal and inconclusive findings on diagnostic imaging of breast: Secondary | ICD-10-CM

## 2012-09-28 DIAGNOSIS — N63 Unspecified lump in unspecified breast: Secondary | ICD-10-CM

## 2012-09-28 NOTE — Progress Notes (Signed)
31 YO complaining of a 10 day history of a left breast nodule noticed while bathing.  Only a little discomfort but no real pain, no nipple discharge, trauma or upper body exercises.  Has a remote family history of breast cancer.  O: Breasts:  no palpable nodules or thickening of either breast;  there is no dimpling, nipple discharge or retractions and no axillary adenopathy; area of concern      perhaps ribs?  A:  Patient Identified Left Breast Nodule (not identified by clinician0  P: Left Breast Dx MG       RTO-as scheduled or prn  Alfredo Collymore, PA-C

## 2012-10-07 ENCOUNTER — Ambulatory Visit
Admission: RE | Admit: 2012-10-07 | Discharge: 2012-10-07 | Disposition: A | Discharge status: Home / Self Care | Payer: PRIVATE HEALTH INSURANCE | Source: Ambulatory Visit | Attending: Obstetrics and Gynecology | Admitting: Obstetrics and Gynecology

## 2012-10-07 DIAGNOSIS — N63 Unspecified lump in unspecified breast: Secondary | ICD-10-CM

## 2012-10-23 ENCOUNTER — Telehealth: Payer: Self-pay | Admitting: Oncology

## 2012-10-23 NOTE — Telephone Encounter (Signed)
Called pt and left message regarding rescheduling appt with Dr.Odogwu , now a patient of Dr. Cyndie Chime, waiting for a call back

## 2012-11-06 ENCOUNTER — Encounter: Payer: Self-pay | Admitting: *Deleted

## 2012-11-16 ENCOUNTER — Telehealth: Payer: Self-pay | Admitting: Oncology

## 2012-11-16 NOTE — Telephone Encounter (Signed)
Per LT 1/17 f/u r/s. S/w pt today re reassignment and new appts for lb 2/10 and LT 2/14.

## 2012-11-17 ENCOUNTER — Other Ambulatory Visit: Payer: PRIVATE HEALTH INSURANCE | Admitting: Lab

## 2012-11-17 ENCOUNTER — Other Ambulatory Visit: Payer: Self-pay | Admitting: Lab

## 2012-11-19 ENCOUNTER — Ambulatory Visit: Payer: PRIVATE HEALTH INSURANCE | Admitting: Hematology and Oncology

## 2012-11-19 ENCOUNTER — Ambulatory Visit: Payer: Self-pay | Admitting: Nurse Practitioner

## 2012-11-29 ENCOUNTER — Other Ambulatory Visit: Payer: BC Managed Care – PPO

## 2012-11-29 DIAGNOSIS — Z3009 Encounter for other general counseling and advice on contraception: Secondary | ICD-10-CM

## 2012-11-29 MED ORDER — MEDROXYPROGESTERONE ACETATE 150 MG/ML IM SUSP
150.0000 mg | Freq: Once | INTRAMUSCULAR | Status: AC
  Administered 2012-11-29: 150 mg via INTRAMUSCULAR

## 2012-11-29 NOTE — Progress Notes (Unsigned)
Next Depo due 02-20-2013

## 2012-12-03 ENCOUNTER — Telehealth: Payer: Self-pay | Admitting: Oncology

## 2012-12-13 ENCOUNTER — Other Ambulatory Visit: Payer: Self-pay | Admitting: Lab

## 2012-12-17 ENCOUNTER — Encounter: Payer: Self-pay | Admitting: Oncology

## 2012-12-17 ENCOUNTER — Ambulatory Visit: Payer: Self-pay | Admitting: Nurse Practitioner

## 2012-12-17 ENCOUNTER — Ambulatory Visit: Payer: Self-pay | Admitting: Oncology

## 2012-12-17 NOTE — Progress Notes (Unsigned)
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 Dalene Carrow. She was supposed to see me for the first time today. She failed to report for the visit. She has stable hemoglobins in the 11-12 range. She really does not need to be followed in this office any longer and can be followed exclusively by her primary care physician.Marland Kitchen

## 2012-12-21 ENCOUNTER — Encounter: Payer: BC Managed Care – PPO | Admitting: Obstetrics and Gynecology

## 2012-12-29 ENCOUNTER — Encounter: Payer: Self-pay | Admitting: Obstetrics and Gynecology

## 2012-12-29 ENCOUNTER — Ambulatory Visit: Payer: BC Managed Care – PPO | Admitting: Obstetrics and Gynecology

## 2012-12-29 VITALS — BP 110/72 | HR 78 | Temp 98.7°F | Wt 159.0 lb

## 2012-12-29 DIAGNOSIS — N926 Irregular menstruation, unspecified: Secondary | ICD-10-CM

## 2012-12-29 DIAGNOSIS — N390 Urinary tract infection, site not specified: Secondary | ICD-10-CM

## 2012-12-29 DIAGNOSIS — R102 Pelvic and perineal pain: Secondary | ICD-10-CM

## 2012-12-29 DIAGNOSIS — D582 Other hemoglobinopathies: Secondary | ICD-10-CM

## 2012-12-29 LAB — POCT URINALYSIS DIPSTICK
Bilirubin, UA: NEGATIVE
Nitrite, UA: NEGATIVE
pH, UA: 7

## 2012-12-29 LAB — POCT URINE PREGNANCY: Preg Test, Ur: NEGATIVE

## 2012-12-29 MED ORDER — CIPROFLOXACIN HCL 250 MG PO TABS: 250.0000 mg | ORAL_TABLET | Freq: Two times a day (BID) | ORAL | Status: DC

## 2012-12-29 NOTE — Progress Notes (Signed)
32 YO with endometriosis and who is S/P Operative L/S 08/06/12 for ablation of endometriosis with subsequent Depo Provera therapy presents with complaints of irregular bleeding since surgery. Changes pad every 2 hours for 2 weeks then spotting otherwise all accompanied by 4/10 cramping that is not aggravated by anything. Denies any changes in bowel function, urinary tract function, or vaginitis symptoms.  Consume 3  servings of liquids a day and holds her urine.  Last Depo injection was in January so is not due until April.   O: Abdomen: soft, non-tender without masses       Pelvic: EGBUS-wnl, vagina-normal, cervix-no lesions and without tenderness, uterus-normal size and adnexae-no tenderness or masses   U/A:  pH-7,    SG-1.010,  trace-protein,  4+ leukocytes  [urine for culture] UPT-negative  A:  UTI      Irregular Bleeding     Endometriosis  P:  Patient requests labs to be drawn her as ordered by her Hematologist-Dr. Odogwu: CBC, ferritin, iron & TIBC,       folate, CMET       Cipro 250 mg #14 bid x 7 days no refills;  increase liquids and stop holding urine        Urine for culture        RTO- as scheduled or prn  Karrie Fluellen, PA-C

## 2012-12-29 NOTE — Progress Notes (Signed)
When did bleeding start: after surgery How  Long: starting to go away How often changing pad/tampon: every 2 hours Bleeding Disorders: no Cramping: yes Contraception: yes depo provera Fibroids: no Hormone Therapy: no New Medications: no Menopausal Symptoms: no Vag. Discharge: no Abdominal Pain: yes Increased Stress: no

## 2012-12-30 LAB — COMPREHENSIVE METABOLIC PANEL
ALT: 11 U/L (ref 0–35)
AST: 12 U/L (ref 0–37)
Alkaline Phosphatase: 60 U/L (ref 39–117)
BUN: 11 mg/dL (ref 6–23)
Creat: 0.86 mg/dL (ref 0.50–1.10)
Total Bilirubin: 0.6 mg/dL (ref 0.3–1.2)

## 2012-12-30 LAB — CBC
HCT: 35.5 % — ABNORMAL LOW (ref 36.0–46.0)
MCHC: 33.2 g/dL (ref 30.0–36.0)
MCV: 68.9 fL — ABNORMAL LOW (ref 78.0–100.0)
Platelets: 302 10*3/uL (ref 150–400)
RDW: 17.9 % — ABNORMAL HIGH (ref 11.5–15.5)
WBC: 6.8 10*3/uL (ref 4.0–10.5)

## 2012-12-30 LAB — IRON AND TIBC
%SAT: 22 % (ref 20–55)
Iron: 77 ug/dL (ref 42–145)
TIBC: 355 ug/dL (ref 250–470)
UIBC: 278 ug/dL (ref 125–400)

## 2012-12-31 ENCOUNTER — Encounter: Payer: BC Managed Care – PPO | Admitting: Obstetrics and Gynecology

## 2014-04-20 ENCOUNTER — Other Ambulatory Visit: Payer: Self-pay | Admitting: Family Medicine

## 2014-04-20 DIAGNOSIS — M79605 Pain in left leg: Principal | ICD-10-CM

## 2014-04-20 DIAGNOSIS — M79604 Pain in right leg: Secondary | ICD-10-CM

## 2014-04-28 ENCOUNTER — Ambulatory Visit
Admission: RE | Admit: 2014-04-28 | Discharge: 2014-04-28 | Disposition: A | Discharge status: Home / Self Care | Payer: BC Managed Care – PPO | Source: Ambulatory Visit | Attending: Family Medicine | Admitting: Family Medicine

## 2014-04-28 DIAGNOSIS — M79604 Pain in right leg: Secondary | ICD-10-CM

## 2014-04-28 DIAGNOSIS — M79605 Pain in left leg: Principal | ICD-10-CM

## 2014-07-03 ENCOUNTER — Other Ambulatory Visit: Payer: Self-pay | Admitting: Obstetrics and Gynecology

## 2014-09-04 ENCOUNTER — Encounter: Payer: Self-pay | Admitting: Obstetrics and Gynecology

## 2015-07-17 ENCOUNTER — Other Ambulatory Visit: Payer: Self-pay | Admitting: Obstetrics and Gynecology

## 2015-08-14 ENCOUNTER — Other Ambulatory Visit: Payer: Self-pay | Admitting: Obstetrics and Gynecology

## 2015-08-28 ENCOUNTER — Encounter (HOSPITAL_BASED_OUTPATIENT_CLINIC_OR_DEPARTMENT_OTHER): Payer: Self-pay | Admitting: *Deleted

## 2015-08-29 ENCOUNTER — Encounter (HOSPITAL_BASED_OUTPATIENT_CLINIC_OR_DEPARTMENT_OTHER): Payer: Self-pay | Admitting: *Deleted

## 2015-08-29 NOTE — Progress Notes (Signed)
NPO AFTER MN.  ARRIVE AT [id number].  GETTING LAB WORK DONE 08-30-2015.  WILL TAKE AM MED W/ SIPS OF WATER AND IF NEEDED TAKE HYDROCODONE.

## 2015-08-31 DIAGNOSIS — D563 Thalassemia minor: Secondary | ICD-10-CM | POA: Diagnosis not present

## 2015-08-31 DIAGNOSIS — Z87891 Personal history of nicotine dependence: Secondary | ICD-10-CM | POA: Diagnosis not present

## 2015-08-31 DIAGNOSIS — N946 Dysmenorrhea, unspecified: Secondary | ICD-10-CM | POA: Diagnosis present

## 2015-08-31 DIAGNOSIS — E669 Obesity, unspecified: Secondary | ICD-10-CM | POA: Diagnosis not present

## 2015-08-31 DIAGNOSIS — F419 Anxiety disorder, unspecified: Secondary | ICD-10-CM | POA: Diagnosis not present

## 2015-08-31 DIAGNOSIS — R102 Pelvic and perineal pain: Secondary | ICD-10-CM | POA: Diagnosis not present

## 2015-08-31 DIAGNOSIS — D649 Anemia, unspecified: Secondary | ICD-10-CM | POA: Diagnosis not present

## 2015-08-31 DIAGNOSIS — N803 Endometriosis of pelvic peritoneum: Secondary | ICD-10-CM | POA: Diagnosis not present

## 2015-08-31 DIAGNOSIS — Z6832 Body mass index (BMI) 32.0-32.9, adult: Secondary | ICD-10-CM | POA: Diagnosis not present

## 2015-08-31 LAB — CBC
HCT: 33.8 % — ABNORMAL LOW (ref 36.0–46.0)
HEMOGLOBIN: 11.1 g/dL — AB (ref 12.0–15.0)
MCH: 23 pg — ABNORMAL LOW (ref 26.0–34.0)
MCHC: 32.8 g/dL (ref 30.0–36.0)
MCV: 70.1 fL — ABNORMAL LOW (ref 78.0–100.0)
PLATELETS: 295 10*3/uL (ref 150–400)
RBC: 4.82 MIL/uL (ref 3.87–5.11)
RDW: 15.1 % (ref 11.5–15.5)
WBC: 6.2 10*3/uL (ref 4.0–10.5)

## 2015-09-03 NOTE — H&P (Signed)
Admission History and Physical Exam for a Gynecology Patient  [name] is a 34 y.o. female, [phone], who presents for a diagnostic laparoscopy. She has been followed at the [facility] and Gynecology division of [facility]. The patient had laparoscopy in 2002 and again in 2013. She has biopsy-proven endometriosis. She complains of increasing dysmenorrhea that has not responded to medical management. She has been treated in the past with Lupron and with Depo-Provera.  OB History    Gravida Para Term Preterm AB TAB SAB Ectopic Multiple Living   4 2   1  1   2       Past Medical History  Diagnosis Date  . Anxiety   . Endometriosis   . Thalassemia minor   . Pelvic pain in female   . History of cervical dysplasia     CIN I  . PONV (postoperative nausea and vomiting)   . Wears glasses     No prescriptions prior to admission    Past Surgical History  Procedure Laterality Date  . Wisdom tooth extraction  1990's  . Laparoscopy  2002    for endometriosis  . Laparoscopy  08/06/2012    Procedure: LAPAROSCOPY OPERATIVE;  Surgeon: Ena Dawley, MD;  Location: [facility];  Service: Gynecology;  Laterality: N/A;  Laparoscopic Pelvic Biopsies    No Known Allergies  Family History: family history includes Cancer in her maternal grandfather and paternal aunt; Diabetes in her maternal aunt, maternal uncle, paternal aunt, paternal grandmother, and paternal uncle; Hypertension in her maternal aunt, maternal grandfather, maternal grandmother, maternal uncle, mother, paternal aunt, paternal grandfather, and paternal uncle; Stroke in her maternal grandfather, paternal grandfather, and paternal uncle.  Social History:  reports that she quit smoking about 4 years ago. Her smoking use included Cigarettes. She has a 2 pack-year smoking history. She has never used smokeless tobacco. She reports that she drinks alcohol. She reports that she does not use illicit  drugs.  Review of systems: See HPI.  Admission Physical Exam:    Body mass index is 32.81 kg/(m^2).  Height 5' (1.524 m), weight 168 lb (76.204 kg), last menstrual period 08/29/2015.  HEENT:                 Within normal limits Chest:                   Clear Heart:                    Regular rate and rhythm Breasts:                No masses, skin changes, bleeding, or discharge present Abdomen:             Nontender, no masses Extremities:          Grossly normal Neurologic exam: Grossly normal  Pelvic exam:  External genitalia: normal general appearance Vaginal: normal without tenderness, induration or masses Cervix: normal appearance Adnexa: normal bimanual exam Uterus: Normal size shape and consistency Rectal: good sphincter tone and no masses   CBC    Component Value Date/Time   WBC 6.2 08/31/2015 1530   WBC 4.6 04/13/2012 0815   RBC 4.82 08/31/2015 1530   RBC 5.07 04/13/2012 0815   HGB 11.1* 08/31/2015 1530   HGB 11.8 04/13/2012 0815   HCT 33.8* 08/31/2015 1530   HCT 36.1 04/13/2012 0815   PLT 295 08/31/2015 1530   PLT 263 04/13/2012 0815  MCV 70.1* 08/31/2015 1530   MCV 71.3* 04/13/2012 0815   MCH 23.0* 08/31/2015 1530   MCH 23.2* 04/13/2012 0815   MCHC 32.8 08/31/2015 1530   MCHC 32.5 04/13/2012 0815   RDW 15.1 08/31/2015 1530   RDW 17.0* 04/13/2012 0815   LYMPHSABS 1.5 04/13/2012 0815   MONOABS 0.3 04/13/2012 0815   EOSABS 0.1 04/13/2012 0815   BASOSABS 0.0 04/13/2012 0815   Assessment:  Pelvic pain  Endometriosis  Worsening dysmenorrhea  Anxiety  Anemia  Obesity  Plan:  The patient will undergo a diagnostic laparoscopy. She understands the indications for her surgical procedure. She accepts the risk of, but not limited to, anesthetic complications, bleeding, infections, and possible damage to the surrounding organs. She understands that no guarantees can be given concerning the total relief of her pelvic pain.   Eli Hose 09/03/2015

## 2015-09-04 ENCOUNTER — Ambulatory Visit (HOSPITAL_BASED_OUTPATIENT_CLINIC_OR_DEPARTMENT_OTHER)
Admission: RE | Admit: 2015-09-04 | Discharge: 2015-09-04 | Disposition: A | Discharge status: Home / Self Care | Payer: Managed Care, Other (non HMO) | Source: Ambulatory Visit | Attending: Obstetrics and Gynecology | Admitting: Obstetrics and Gynecology

## 2015-09-04 ENCOUNTER — Encounter (HOSPITAL_BASED_OUTPATIENT_CLINIC_OR_DEPARTMENT_OTHER): Payer: Self-pay | Admitting: *Deleted

## 2015-09-04 ENCOUNTER — Encounter (HOSPITAL_BASED_OUTPATIENT_CLINIC_OR_DEPARTMENT_OTHER)
Admission: RE | Disposition: A | Discharge status: Home / Self Care | Payer: Self-pay | Source: Ambulatory Visit | Attending: Obstetrics and Gynecology

## 2015-09-04 ENCOUNTER — Ambulatory Visit (HOSPITAL_BASED_OUTPATIENT_CLINIC_OR_DEPARTMENT_OTHER): Payer: Managed Care, Other (non HMO) | Admitting: Anesthesiology

## 2015-09-04 DIAGNOSIS — N809 Endometriosis, unspecified: Secondary | ICD-10-CM

## 2015-09-04 DIAGNOSIS — Z6832 Body mass index (BMI) 32.0-32.9, adult: Secondary | ICD-10-CM | POA: Insufficient documentation

## 2015-09-04 DIAGNOSIS — N803 Endometriosis of pelvic peritoneum: Secondary | ICD-10-CM | POA: Insufficient documentation

## 2015-09-04 DIAGNOSIS — E669 Obesity, unspecified: Secondary | ICD-10-CM | POA: Insufficient documentation

## 2015-09-04 DIAGNOSIS — Z87891 Personal history of nicotine dependence: Secondary | ICD-10-CM | POA: Insufficient documentation

## 2015-09-04 DIAGNOSIS — D649 Anemia, unspecified: Secondary | ICD-10-CM | POA: Insufficient documentation

## 2015-09-04 DIAGNOSIS — R102 Pelvic and perineal pain: Secondary | ICD-10-CM | POA: Insufficient documentation

## 2015-09-04 DIAGNOSIS — N946 Dysmenorrhea, unspecified: Secondary | ICD-10-CM | POA: Diagnosis not present

## 2015-09-04 DIAGNOSIS — F419 Anxiety disorder, unspecified: Secondary | ICD-10-CM

## 2015-09-04 DIAGNOSIS — D563 Thalassemia minor: Secondary | ICD-10-CM | POA: Insufficient documentation

## 2015-09-04 HISTORY — DX: Nausea with vomiting, unspecified: R11.2

## 2015-09-04 HISTORY — DX: Pelvic and perineal pain: R10.2

## 2015-09-04 HISTORY — PX: LAPAROSCOPY: SHX197

## 2015-09-04 HISTORY — DX: Personal history of cervical dysplasia: Z87.410

## 2015-09-04 HISTORY — DX: Presence of spectacles and contact lenses: Z97.3

## 2015-09-04 HISTORY — DX: Nausea with vomiting, unspecified: Z98.890

## 2015-09-04 LAB — POCT PREGNANCY, URINE: Preg Test, Ur: NEGATIVE

## 2015-09-04 SURGERY — LAPAROSCOPY OPERATIVE
Anesthesia: General | Site: Abdomen

## 2015-09-04 MED ORDER — PROPOFOL 10 MG/ML IV BOLUS
INTRAVENOUS | Status: DC | PRN
  Administered 2015-09-04: 180 mg via INTRAVENOUS

## 2015-09-04 MED ORDER — FENTANYL CITRATE (PF) 100 MCG/2ML IJ SOLN
INTRAMUSCULAR | Status: AC
  Filled 2015-09-04: qty 6

## 2015-09-04 MED ORDER — MIDAZOLAM HCL 2 MG/2ML IJ SOLN
INTRAMUSCULAR | Status: AC
  Filled 2015-09-04: qty 2

## 2015-09-04 MED ORDER — BUPIVACAINE-EPINEPHRINE 0.5% -1:200000 IJ SOLN
INTRAMUSCULAR | Status: DC | PRN
  Administered 2015-09-04: 6 mL

## 2015-09-04 MED ORDER — GLYCOPYRROLATE 0.2 MG/ML IJ SOLN
INTRAMUSCULAR | Status: DC | PRN
  Administered 2015-09-04: 0.4 mg via INTRAVENOUS

## 2015-09-04 MED ORDER — FENTANYL CITRATE (PF) 100 MCG/2ML IJ SOLN
INTRAMUSCULAR | Status: DC | PRN
  Administered 2015-09-04: 50 ug via INTRAVENOUS
  Administered 2015-09-04: 25 ug via INTRAVENOUS
  Administered 2015-09-04 (×2): 50 ug via INTRAVENOUS
  Administered 2015-09-04: 25 ug via INTRAVENOUS

## 2015-09-04 MED ORDER — SUCCINYLCHOLINE CHLORIDE 20 MG/ML IJ SOLN
INTRAMUSCULAR | Status: DC | PRN
  Administered 2015-09-04: 100 mg via INTRAVENOUS

## 2015-09-04 MED ORDER — HYDROCODONE-ACETAMINOPHEN 5-300 MG PO TABS: 1.0000 | ORAL_TABLET | ORAL | Status: DC | PRN

## 2015-09-04 MED ORDER — ONDANSETRON HCL 4 MG/2ML IJ SOLN
INTRAMUSCULAR | Status: DC | PRN
  Administered 2015-09-04: 4 mg via INTRAVENOUS

## 2015-09-04 MED ORDER — NEOSTIGMINE METHYLSULFATE 10 MG/10ML IV SOLN
INTRAVENOUS | Status: DC | PRN
  Administered 2015-09-04: 3 mg via INTRAVENOUS

## 2015-09-04 MED ORDER — MIDAZOLAM HCL 5 MG/5ML IJ SOLN
INTRAMUSCULAR | Status: DC | PRN
  Administered 2015-09-04: 2 mg via INTRAVENOUS

## 2015-09-04 MED ORDER — LACTATED RINGERS IV SOLN
INTRAVENOUS | Status: DC
  Administered 2015-09-04 (×2): via INTRAVENOUS
  Filled 2015-09-04: qty 1000

## 2015-09-04 MED ORDER — PROMETHAZINE HCL 12.5 MG PO TABS: 12.5000 mg | ORAL_TABLET | Freq: Four times a day (QID) | ORAL | Status: DC | PRN

## 2015-09-04 MED ORDER — PROMETHAZINE HCL 25 MG/ML IJ SOLN
INTRAMUSCULAR | Status: AC
  Filled 2015-09-04: qty 1

## 2015-09-04 MED ORDER — PROMETHAZINE HCL 25 MG/ML IJ SOLN
6.2500 mg | INTRAMUSCULAR | Status: DC | PRN
  Administered 2015-09-04: 6.25 mg via INTRAVENOUS
  Filled 2015-09-04: qty 1

## 2015-09-04 MED ORDER — HYDROMORPHONE HCL 1 MG/ML IJ SOLN
0.2500 mg | INTRAMUSCULAR | Status: DC | PRN
  Filled 2015-09-04: qty 1

## 2015-09-04 MED ORDER — DEXAMETHASONE SODIUM PHOSPHATE 4 MG/ML IJ SOLN
INTRAMUSCULAR | Status: DC | PRN
  Administered 2015-09-04: 10 mg via INTRAVENOUS

## 2015-09-04 MED ORDER — IBUPROFEN 800 MG PO TABS: 800.0000 mg | ORAL_TABLET | Freq: Three times a day (TID) | ORAL | Status: DC | PRN

## 2015-09-04 MED ORDER — KETOROLAC TROMETHAMINE 30 MG/ML IJ SOLN
INTRAMUSCULAR | Status: DC | PRN
  Administered 2015-09-04: 30 mg via INTRAVENOUS

## 2015-09-04 MED ORDER — ROCURONIUM BROMIDE 100 MG/10ML IV SOLN
INTRAVENOUS | Status: DC | PRN
  Administered 2015-09-04: 20 mg via INTRAVENOUS

## 2015-09-04 MED ORDER — LIDOCAINE HCL (CARDIAC) 20 MG/ML IV SOLN
INTRAVENOUS | Status: DC | PRN
  Administered 2015-09-04: 60 mg via INTRAVENOUS

## 2015-09-04 SURGICAL SUPPLY — 61 items
BAG SPEC RTRVL LRG 6X4 10 (ENDOMECHANICALS)
BAG URINE DRAINAGE (UROLOGICAL SUPPLIES) ×2 IMPLANT
BANDAGE ADH SHEER 1  50/CT (GAUZE/BANDAGES/DRESSINGS) ×1 IMPLANT
BLADE SURG 11 STRL SS (BLADE) ×2 IMPLANT
CATH FOLEY 2WAY SLVR  5CC 14FR (CATHETERS) ×1
CATH FOLEY 2WAY SLVR 5CC 14FR (CATHETERS) ×1 IMPLANT
CATH ROBINSON RED A/P 16FR (CATHETERS) IMPLANT
CHLORAPREP W/TINT 26ML (MISCELLANEOUS) ×2 IMPLANT
COVER MAYO STAND STRL (DRAPES) ×2 IMPLANT
DRAPE UNDERBUTTOCKS STRL (DRAPE) ×2 IMPLANT
DRSG COVADERM PLUS 2X2 (GAUZE/BANDAGES/DRESSINGS) IMPLANT
DRSG OPSITE POSTOP 3X4 (GAUZE/BANDAGES/DRESSINGS) IMPLANT
DRSG TELFA 3X8 NADH (GAUZE/BANDAGES/DRESSINGS) ×2 IMPLANT
FORCEPS CUTTING 33CM 5MM (CUTTING FORCEPS) IMPLANT
FORCEPS CUTTING 45CM 5MM (CUTTING FORCEPS) IMPLANT
GLOVE BIOGEL PI IND STRL 7.5 (GLOVE) IMPLANT
GLOVE BIOGEL PI IND STRL 8.5 (GLOVE) ×1 IMPLANT
GLOVE BIOGEL PI INDICATOR 7.5 (GLOVE) ×1
GLOVE BIOGEL PI INDICATOR 8.5 (GLOVE) ×1
GLOVE ECLIPSE 8.0 STRL XLNG CF (GLOVE) ×4 IMPLANT
GLOVE INDICATOR 7.5 STRL GRN (GLOVE) ×2 IMPLANT
GOWN STRL REUS W/TWL 2XL LVL3 (GOWN DISPOSABLE) ×1 IMPLANT
GOWN STRL REUS W/TWL LRG LVL3 (GOWN DISPOSABLE) ×4 IMPLANT
HOLDER FOLEY CATH W/STRAP (MISCELLANEOUS) ×2 IMPLANT
KIT ROOM TURNOVER WOR (KITS) ×2 IMPLANT
LEGGING LITHOTOMY PAIR STRL (DRAPES) ×2 IMPLANT
MANIFOLD NEPTUNE II (INSTRUMENTS) IMPLANT
NDL HYPO 25X1 1.5 SAFETY (NEEDLE) ×1 IMPLANT
NDL INSUFFLATION 14GA 120MM (NEEDLE) ×1 IMPLANT
NEEDLE HYPO 25X1 1.5 SAFETY (NEEDLE) ×2 IMPLANT
NEEDLE INSUFFLATION 14GA 120MM (NEEDLE) ×2 IMPLANT
NS IRRIG 500ML POUR BTL (IV SOLUTION) ×2 IMPLANT
PACK BASIN DAY SURGERY FS (CUSTOM PROCEDURE TRAY) ×2 IMPLANT
PACK LAPAROSCOPY II (CUSTOM PROCEDURE TRAY) ×2 IMPLANT
PAD DRESSING TELFA 3X8 NADH (GAUZE/BANDAGES/DRESSINGS) IMPLANT
PAD OB MATERNITY 4.3X12.25 (PERSONAL CARE ITEMS) ×2 IMPLANT
PAD PREP 24X48 CUFFED NSTRL (MISCELLANEOUS) ×2 IMPLANT
PADDING ION DISPOSABLE (MISCELLANEOUS) ×2 IMPLANT
POUCH SPECIMEN RETRIEVAL 10MM (ENDOMECHANICALS) IMPLANT
SET IRRIG TUBING LAPAROSCOPIC (IRRIGATION / IRRIGATOR) IMPLANT
SHEARS HARMONIC ACE PLUS 36CM (ENDOMECHANICALS) IMPLANT
SOLUTION ANTI FOG 6CC (MISCELLANEOUS) ×2 IMPLANT
STRIP CLOSURE SKIN 1/4X3 (GAUZE/BANDAGES/DRESSINGS) IMPLANT
STRIP CLOSURE SKIN 1/4X4 (GAUZE/BANDAGES/DRESSINGS) ×1 IMPLANT
SUT MNCRL AB 3-0 PS2 27 (SUTURE) ×2 IMPLANT
SUT VIC AB 2-0 UR6 27 (SUTURE) ×4 IMPLANT
SUT VICRYL 0 ENDOLOOP (SUTURE) IMPLANT
SYR 30ML LL (SYRINGE) ×1 IMPLANT
SYR 50ML LL SCALE MARK (SYRINGE) IMPLANT
SYR CONTROL 10ML LL (SYRINGE) ×2 IMPLANT
SYRINGE 10CC LL (SYRINGE) ×3 IMPLANT
TOWEL OR 17X24 6PK STRL BLUE (TOWEL DISPOSABLE) ×4 IMPLANT
TRAY DSU PREP LF (CUSTOM PROCEDURE TRAY) ×2 IMPLANT
TROCAR BALLN 12MMX100 BLUNT (TROCAR) ×2 IMPLANT
TROCAR BLADELESS OPT 5 100 (ENDOMECHANICALS) ×2 IMPLANT
TROCAR XCEL DIL TIP R 11M (ENDOMECHANICALS) IMPLANT
TROCAR XCEL NON-BLD 11X100MML (ENDOMECHANICALS) ×1 IMPLANT
TUBE CONNECTING 12X1/4 (SUCTIONS) IMPLANT
TUBING INSUFFLATION W/FILTER (TUBING) ×2 IMPLANT
WARMER LAPAROSCOPE (MISCELLANEOUS) ×2 IMPLANT
WATER STERILE IRR 500ML POUR (IV SOLUTION) ×2 IMPLANT

## 2015-09-04 NOTE — Anesthesia Preprocedure Evaluation (Addendum)
Anesthesia Evaluation  Patient identified by MRN, date of birth, ID band Patient awake    Reviewed: Allergy & Precautions, NPO status , Patient's Chart, lab work & pertinent test results  History of Anesthesia Complications (+) PONV  Airway Mallampati: I       Dental   Pulmonary former smoker,    breath sounds clear to auscultation       Cardiovascular negative cardio ROS   Rhythm:Regular Rate:Normal     Neuro/Psych    GI/Hepatic negative GI ROS, Neg liver ROS,   Endo/Other  negative endocrine ROS  Renal/GU negative Renal ROS     Musculoskeletal   Abdominal   Peds  Hematology  (+) anemia ,   Anesthesia Other Findings   Reproductive/Obstetrics                            Anesthesia Physical Anesthesia Plan  ASA: II  Anesthesia Plan: General   Post-op Pain Management:    Induction: Intravenous  Airway Management Planned: Oral ETT  Additional Equipment:   Intra-op Plan:   Post-operative Plan: Extubation in OR  Informed Consent: I have reviewed the patients History and Physical, chart, labs and discussed the procedure including the risks, benefits and alternatives for the proposed anesthesia with the patient or authorized representative who has indicated his/her understanding and acceptance.   Dental advisory given  Plan Discussed with: CRNA and Surgeon  Anesthesia Plan Comments:         Anesthesia Quick Evaluation

## 2015-09-04 NOTE — Discharge Instructions (Signed)
Endometriosis Endometriosis is a condition in which the tissue that lines the uterus (endometrium) grows outside of its normal location. The tissue may grow in many locations close to the uterus, but it commonly grows on the ovaries, fallopian tubes, vagina, or bowel. Because the uterus expels, or sheds, its lining every menstrual cycle, there is bleeding wherever the endometrial tissue is located. This can cause pain because blood is irritating to tissues not normally exposed to it.  CAUSES  The cause of endometriosis is not known.  SIGNS AND SYMPTOMS  Often, there are no symptoms. When symptoms are present, they can vary with the location of the displaced tissue. Various symptoms can occur at different times. Although symptoms occur mainly during a woman's menstrual period, they can also occur midcycle and usually stop with menopause. Some people may go months with no symptoms at all. Symptoms may include:   Back or abdominal pain.   Heavier bleeding during periods.   Pain during intercourse.   Painful bowel movements.   Infertility. DIAGNOSIS  Your health care provider will do a physical exam and ask about your symptoms. Various tests may be done, such as:   Blood tests and urine tests. These are done to help rule out other problems.   Ultrasound. This test is done to look for abnormal tissue.   An X-ray of the lower bowel (barium enema).  Laparoscopy. In this procedure, a thin, lighted tube with a tiny camera on the end (laparoscope) is inserted into your abdomen. This helps your health care provider look for abnormal tissue to confirm the diagnosis. The health care provider may also remove a small piece of tissue (biopsy) from any abnormal tissue found. This tissue sample can then be sent to a lab so it can be looked at under a microscope. TREATMENT  Treatment will vary and may include:   Medicines to relieve pain. Nonsteroidal anti-inflammatory drugs (NSAIDs) are a type of  pain medicine that can help to relieve the pain caused by endometriosis.  Hormonal therapy. When using hormonal therapy, periods are eliminated. This eliminates the monthly exposure to blood by the displaced endometrial tissue.   Surgery. Surgery may sometimes be done to remove the abnormal endometrial tissue. In severe cases, surgery may be done to remove the fallopian tubes, uterus, and ovaries (hysterectomy). HOME CARE INSTRUCTIONS   Take all medicines as directed by your health care provider. Do not take aspirin because it may increase bleeding when you are not on hormonal therapy.   Avoid activities that produce pain, including sexual activity. SEEK MEDICAL CARE IF:  You have pelvic pain before, after, or during your periods.  You have pelvic pain between periods that gets worse during your period.  You have pelvic pain during or after sex.  You have pelvic pain with bowel movements or urination, especially during your period.  You have problems getting pregnant.  You have a fever. SEEK IMMEDIATE MEDICAL CARE IF:   Your pain is severe and is not responding to pain medicine.   You have severe nausea and vomiting, or you cannot keep foods down.   You have pain that is limited to the right lower part of your abdomen.   You have swelling or increasing pain in your abdomen.   You see blood in your stool.  MAKE SURE YOU:   Understand these instructions.  Will watch your condition.  Will get help right away if you are not doing well or get worse.   This information  is not intended to replace advice given to you by your health care provider. Make sure you discuss any questions you have with your health care provider.   Document Released: 10/17/2000 Document Revised: 11/10/2014 Document Reviewed: 06/17/2013 Elsevier Interactive Patient Education 2016 [place]: The bandaids or dressing which are placed over the  skin openings may be removed the day after surgery. The incision should be kept clean and dry. The stitches do not need to be removed. Should the incision become sore, red, and swollen after the first week, check with your doctor.  Personal Hygiene: Shower the day after your procedure. Always wipe from front to back after elimination.   Activity: Do not drive or operate any equipment today. The effects of the anesthesia are still present and drowsiness may result. Rest today, not necessarily flat bed rest, just take it easy. You may resume your normal activity in one to three days or as instructed by your physician.  Sexual Activity: You resume sexual activity as indicated by your physician_________. If your laparoscopy was for a sterilization ( tubes tied ), continue current method of birth control until after your next period or ask for specific instructions from your doctor.  Diet: Eat a light diet as desired this evening. You may resume a regular diet tomorrow.  Return to Work: Two to three days or as indicated by your doctor.  Expectations After Surgery: Your surgery will cause vaginal drainage or spotting which may continue for 2-3 days. Mild abdominal discomfort or tenderness is not unusual and some shoulder pain may also be noted which can be relieved by lying flat in pain.  Call Your Doctor If these Occur:  Persistent or heavy bleeding at incision site       Redness or swelling around incision       Elevation of temperature greater than 100 degrees F  Call for follow-up appointment _____________. Post Anesthesia Home Care Instructions  Activity: Get plenty of rest for the remainder of the day. A responsible adult should stay with you for 24 hours following the procedure.  For the next 24 hours, DO NOT: -Drive a car -Paediatric nurse -Drink alcoholic beverages -Take any medication unless instructed by your physician -Make any legal decisions or sign important  papers.  Meals: Start with liquid foods such as gelatin or soup. Progress to regular foods as tolerated. Avoid greasy, spicy, heavy foods. If nausea and/or vomiting occur, drink only clear liquids until the nausea and/or vomiting subsides. Call your physician if vomiting continues.  Special Instructions/Symptoms: Your throat may feel dry or sore from the anesthesia or the breathing tube placed in your throat during surgery. If this causes discomfort, gargle with warm salt water. The discomfort should disappear within 24 hours.  If you had a scopolamine patch placed behind your ear for the management of post- operative nausea and/or vomiting:  1. The medication in the patch is effective for 72 hours, after which it should be removed.  Wrap patch in a tissue and discard in the trash. Wash hands thoroughly with soap and water. 2. You may remove the patch earlier than 72 hours if you experience unpleasant side effects which may include dry mouth, dizziness or visual disturbances. 3. Avoid touching the patch. Wash your hands with soap and water after contact with the patch.

## 2015-09-04 NOTE — H&P (Signed)
The patient was interviewed and examined today.  The previously documented history and physical examination was reviewed. There are no changes. The operative procedure was reviewed. The risks and benefits were outlined again. The specific risks include, but are not limited to, anesthetic complications, bleeding, infections, and possible damage to the surrounding organs. The patient's questions were answered.  We are ready to proceed as outlined. The likelihood of the patient achieving the goals of this procedure is very likely.   BP 115/63 mmHg  Pulse 67  Temp(Src) 97.9 F (36.6 C) (Oral)  Resp 16  Ht 5' (1.524 m)  Wt 165 lb (74.844 kg)  BMI 32.22 kg/m2  SpO2 100%  LMP 08/29/2015 (Exact Date)  Gildardo Cranker, M.D. 09/04/2015 8:15 a.m.

## 2015-09-04 NOTE — Transfer of Care (Signed)
Immediate Anesthesia Transfer of Care Note  Patient: Elizabeth Benson  Procedure(s) Performed: Procedure(s) (LRB): LAPAROSCOPY OPERATIVE with peritoneal biopsies (N/A)  Patient Location: PACU  Anesthesia Type: General  Level of Consciousness: awake, oriented, sedated and patient cooperative  Airway & Oxygen Therapy: Patient Spontanous Breathing and Patient connected to face mask oxygen  Post-op Assessment: Report given to PACU RN and Post -op Vital signs reviewed and stable  Post vital signs: Reviewed and stable  Complications: No apparent anesthesia complications

## 2015-09-04 NOTE — Anesthesia Procedure Notes (Signed)
Procedure Name: Intubation Date/Time: 09/04/2015 8:25 AM Performed by: Denna Haggard D Pre-anesthesia Checklist: Patient identified, Emergency Drugs available, Suction available and Patient being monitored Patient Re-evaluated:Patient Re-evaluated prior to inductionOxygen Delivery Method: Circle System Utilized Preoxygenation: Pre-oxygenation with 100% oxygen Intubation Type: IV induction Ventilation: Mask ventilation without difficulty Laryngoscope Size: Mac and 3 Grade View: Grade I Tube type: Oral Tube size: 7.0 mm Number of attempts: 1 Airway Equipment and Method: Stylet and Oral airway Placement Confirmation: ETT inserted through vocal cords under direct vision,  positive ETCO2 and breath sounds checked- equal and bilateral Secured at: 21 cm Tube secured with: Tape Dental Injury: Teeth and Oropharynx as per pre-operative assessment

## 2015-09-04 NOTE — Anesthesia Postprocedure Evaluation (Signed)
  Anesthesia Post-op Note  Patient: Elizabeth Benson  Procedure(s) Performed: Procedure(s): LAPAROSCOPY OPERATIVE with peritoneal biopsies (N/A)  Patient Location: PACU  Anesthesia Type:General  Level of Consciousness: awake and alert   Airway and Oxygen Therapy: Patient Spontanous Breathing  Post-op Pain: mild  Post-op Assessment: Post-op Vital signs reviewed              Post-op Vital Signs: stable  Last Vitals:  Filed Vitals:   09/04/15 1045  BP: 99/54  Pulse: 46  Temp:   Resp: 14    Complications: No apparent anesthesia complications

## 2015-09-04 NOTE — Op Note (Signed)
OPERATIVE NOTE  Elizabeth Benson  DOB:1981-01-20  ZOX:[id number]  [id number]:[id number]  Date of Surgery:09/04/2015  Preoperative Diagnosis:  Increasing dysmenorrhea  Pelvic Pain  Endometriosis  Postoperative Diagnosis:  Increasing dysmenorrhea  Pelvic Pain  Stage I Endometriosis  Possible adenomyosis  Procedure:  Diagnostic laparoscopy  Laparoscopic pelvic biopsies  Laparoscopic ablation of endometriosis  Surgeon:  Gildardo Cranker, M.D.  Assistant:  None  Anesthetic:  General  Disposition:  The patient is a 34 year old female, para 2-0-2-2, who has a known history of endometriosis that was biopsy-proven in 2002 and 2013. She complains of increasing dysmenorrhea. She has been treated with hormone therapy and Lupron therapy. She wishes to proceed with laparoscopy at this time. She understands the indications for her surgical procedure. She accepts the risk of, but not limited to, anesthetic complications, bleeding, infections, and possible damage to the surrounding organs.  Findings:  No masses were appreciated on examination under anesthesia. On laparoscopy, the uterus was noted to be slightly large and goggy. The ovaries and the fallopian tubes appear normal except for "powder-burn" lesions on both ovaries. The anterior cul-de-sac was free. There were several hyperpigmented lesions and red lesions in the posterior cul-de-sac mainly to the left of the midline.  They all measured less than 0.3 cm. This appeared to be consistent with endometriosis. The were filmy adhesions noted between the bowel and the right peritoneal sidewall. The appendix and the bowel otherwise appeared normal. The liver and the upper abdomen appeared normal. Pictures were taken.  Procedure:  The patient was taken to the operating room where a general  anesthetic was given. The patient's abdomen, perineum, and vagina were prepped with multiple layers of Betadine. A Foley catheter was placed in the bladder. Examination under anesthesia was performed. A Hulka tenaculum was placed inside the uterus. The patient was sterilely draped. The subumbilical area was injected with 3 cc of half percent Marcaine with epinephrine. A subumbilical incision was made and the Verres needle was inserted into the abdominal cavity without difficulty. Proper placement was confirmed using the fluid test. A pneumoperitoneum was then obtained. The laparoscopic trocar and the laparoscope were substituted for the Veress needle. The pelvis and the bowel were carefully inspected. There was no evidence of damage. 3 cc of half percent Marcaine with epinephrine were injected suprapubically to the left of the midline. A small incision was made and the 5 mm trocar was placed in the abdominal cavity under direct visualization. The pelvic structures were carefully inspected and pictures were taken. The hyperpigmented lesions were biopsied. A total of 2 biopsies were obtained. Care was taken not to damage any of the vital pelvic structures and not to damage the bowel. We then used the bipolar cautery to ablate the peritoneal surface and on the ovaries where perhaps endometriosis was present. Again, care was taken not to damage any of the vital structures. Hemostasis was confirmed throughout. The suprapubic trocar was removed. The pneumoperitoneum was allowed to escape. The subumbilical fascia was closed using 0 Vicryl. All skin incisions were closed using 3-0 Monocryl. Sponge and needle counts were correct. The patient was awakened from her anesthetic without difficulty. She was transported to the recovery room in stable condition. Two biopsies from the pelvis were sent to pathology.  Followup instructions:  The patient will return to see Dr. Raphael Gibney in 2 weeks. She was given a copy of the  postoperative instructions as prepared by [facility] for patients who have endometriosis. The patient will call for questions or concerns.  Discharge medications:  Motrin 800 mg every 8 hours as needed for mild to moderate pain.  Vicodin 5/500 one or 2 tablets every 4 hours as needed for severe pain.  Phenergan 12.5 mg one tablet every 6 hours as needed for nausea.  Gildardo Cranker, M.D.

## 2015-09-05 ENCOUNTER — Encounter (HOSPITAL_BASED_OUTPATIENT_CLINIC_OR_DEPARTMENT_OTHER): Payer: Self-pay | Admitting: Obstetrics and Gynecology

## 2016-06-10 DIAGNOSIS — Z6833 Body mass index (BMI) 33.0-33.9, adult: Secondary | ICD-10-CM | POA: Diagnosis not present

## 2016-06-10 DIAGNOSIS — N870 Mild cervical dysplasia: Secondary | ICD-10-CM | POA: Diagnosis not present

## 2016-06-10 DIAGNOSIS — F3281 Premenstrual dysphoric disorder: Secondary | ICD-10-CM | POA: Diagnosis not present

## 2016-06-10 DIAGNOSIS — Z124 Encounter for screening for malignant neoplasm of cervix: Secondary | ICD-10-CM | POA: Diagnosis not present

## 2016-06-10 DIAGNOSIS — R87612 Low grade squamous intraepithelial lesion on cytologic smear of cervix (LGSIL): Secondary | ICD-10-CM | POA: Diagnosis not present

## 2016-06-10 DIAGNOSIS — N809 Endometriosis, unspecified: Secondary | ICD-10-CM | POA: Diagnosis not present

## 2016-06-10 DIAGNOSIS — Z01411 Encounter for gynecological examination (general) (routine) with abnormal findings: Secondary | ICD-10-CM | POA: Diagnosis not present

## 2016-06-10 DIAGNOSIS — G4700 Insomnia, unspecified: Secondary | ICD-10-CM | POA: Diagnosis not present

## 2016-07-09 ENCOUNTER — Other Ambulatory Visit: Payer: Self-pay | Admitting: Obstetrics and Gynecology

## 2016-07-09 DIAGNOSIS — N870 Mild cervical dysplasia: Secondary | ICD-10-CM | POA: Diagnosis not present

## 2016-07-09 DIAGNOSIS — R87810 Cervical high risk human papillomavirus (HPV) DNA test positive: Secondary | ICD-10-CM | POA: Diagnosis not present

## 2016-07-09 DIAGNOSIS — R87619 Unspecified abnormal cytological findings in specimens from cervix uteri: Secondary | ICD-10-CM | POA: Diagnosis not present

## 2016-07-11 MED FILL — DOXEPIN 10 MG CAPSULE: 10 | 30 days supply | Qty: 30 | Fill #0

## 2016-08-24 ENCOUNTER — Encounter (HOSPITAL_COMMUNITY): Payer: Self-pay

## 2016-08-24 ENCOUNTER — Emergency Department (HOSPITAL_COMMUNITY): Payer: 59

## 2016-08-24 ENCOUNTER — Emergency Department (HOSPITAL_COMMUNITY)
Admission: EM | Admit: 2016-08-24 | Discharge: 2016-08-24 | Disposition: A | Discharge status: Home / Self Care | Payer: 59 | Attending: Emergency Medicine | Admitting: Emergency Medicine

## 2016-08-24 DIAGNOSIS — R079 Chest pain, unspecified: Secondary | ICD-10-CM | POA: Diagnosis not present

## 2016-08-24 DIAGNOSIS — Z87891 Personal history of nicotine dependence: Secondary | ICD-10-CM | POA: Insufficient documentation

## 2016-08-24 DIAGNOSIS — Z79899 Other long term (current) drug therapy: Secondary | ICD-10-CM | POA: Diagnosis not present

## 2016-08-24 DIAGNOSIS — R0789 Other chest pain: Secondary | ICD-10-CM | POA: Insufficient documentation

## 2016-08-24 DIAGNOSIS — R072 Precordial pain: Secondary | ICD-10-CM | POA: Diagnosis present

## 2016-08-24 LAB — BASIC METABOLIC PANEL
Anion gap: 8 (ref 5–15)
BUN: 10 mg/dL (ref 6–20)
CALCIUM: 9.3 mg/dL (ref 8.9–10.3)
CO2: 23 mmol/L (ref 22–32)
CREATININE: 0.72 mg/dL (ref 0.44–1.00)
Chloride: 105 mmol/L (ref 101–111)
GFR calc non Af Amer: >60 mL/min (ref >60)
GLUCOSE: 84 mg/dL (ref 65–99)
Potassium: 3.9 mmol/L (ref 3.5–5.1)
Sodium: 136 mmol/L (ref 135–145)

## 2016-08-24 LAB — I-STAT TROPONIN, ED: TROPONIN I, POC: 0 ng/mL (ref 0.00–0.08)

## 2016-08-24 LAB — CBC
HCT: 37.8 % (ref 36.0–46.0)
Hemoglobin: 12 g/dL (ref 12.0–15.0)
MCH: 22.3 pg — AB (ref 26.0–34.0)
MCHC: 31.7 g/dL (ref 30.0–36.0)
MCV: 70.3 fL — ABNORMAL LOW (ref 78.0–100.0)
PLATELETS: 347 10*3/uL (ref 150–400)
RBC: 5.38 MIL/uL — AB (ref 3.87–5.11)
RDW: 15.6 % — ABNORMAL HIGH (ref 11.5–15.5)
WBC: 5 10*3/uL (ref 4.0–10.5)

## 2016-08-24 LAB — D-DIMER, QUANTITATIVE: D-Dimer, Quant: 0.28 ug/mL-FEU (ref 0.00–0.50)

## 2016-08-24 MED ORDER — KETOROLAC TROMETHAMINE 30 MG/ML IJ SOLN
30.0000 mg | Freq: Once | INTRAMUSCULAR | Status: AC
  Administered 2016-08-24: 30 mg via INTRAVENOUS
  Filled 2016-08-24: qty 1

## 2016-08-24 MED ORDER — NAPROXEN 500 MG PO TABS: 500.0000 mg | ORAL_TABLET | Freq: Two times a day (BID) | ORAL | 0 refills | Status: DC

## 2016-08-24 MED ORDER — METHOCARBAMOL 750 MG PO TABS: 750.0000 mg | ORAL_TABLET | Freq: Four times a day (QID) | ORAL | 0 refills | Status: DC

## 2016-08-24 NOTE — ED Triage Notes (Signed)
Pt c/o awakening this AM w/ centralized cp w/o radiation and right arm numbness. Pt denies dyspnea. Pt denies n/v. Pt denies GERD or Acid Reflux hx. Pt denies asthma hx. Pt A+OX4, non-diaphoretic, speaking in complete sentences.

## 2016-08-24 NOTE — ED Notes (Signed)
Pt educated not to drive with muscle relaxing medication.

## 2016-08-24 NOTE — ED Provider Notes (Signed)
[facility] DEPT Provider Note   CSN: [id number] Arrival date & time: 08/24/16  0825     History   Chief Complaint Chief Complaint  Patient presents with  . Chest Pain  . Numbness    right arm    HPI Elizabeth Benson is a 35 y.o. female.  35 year old female complains of waxing and waning substernal sharp chest discomfort which was last for seconds to minutes. Pain is nonradiating and not associated with dyspnea, diaphoresis. Denies any recent history of trauma. No pleuritic component to this. No palpitations. Denies any syncope or near-syncope. Nothing makes her symptoms better or worse. No treatment use prior to arrival. Denies any history of diabetes high blood pressure or early CAD.      Past Medical History:  Diagnosis Date  . Anxiety   . Endometriosis   . History of cervical dysplasia    CIN I  . Pelvic pain in female   . PONV (postoperative nausea and vomiting)   . Thalassemia minor   . Wears glasses     Patient Active Problem List   Diagnosis Date Noted  . Endometriosis 03/22/2012  . Anxiety 03/22/2012  . Endometriosis   . Thalassemia minor 01/08/2012    Past Surgical History:  Procedure Laterality Date  . LAPAROSCOPY  2002   for endometriosis  . LAPAROSCOPY  08/06/2012   Procedure: LAPAROSCOPY OPERATIVE;  Surgeon: Ena Dawley, MD;  Location: [facility];  Service: Gynecology;  Laterality: N/A;  Laparoscopic Pelvic Biopsies  . LAPAROSCOPY N/A 09/04/2015   Procedure: LAPAROSCOPY OPERATIVE with peritoneal biopsies;  Surgeon: Ena Dawley, MD;  Location: [facility];  Service: Gynecology;  Laterality: N/A;  . WISDOM TOOTH EXTRACTION  1990's    OB History    Gravida Para Term Preterm AB Living   4 2     1 2    SAB TAB Ectopic Multiple Live Births   1               Home Medications    Prior to Admission medications   Medication Sig Start Date End Date Taking? Authorizing Provider  FLUoxetine (PROZAC) 20 MG capsule 1 po qd  day 14-28 of cycle Patient taking differently: Take 20 mg by mouth every morning.  04/20/12   Earnstine Regal, PA-C  HYDROcodone-acetaminophen (NORCO/VICODIN) 5-325 MG tablet Take 1 tablet by mouth every 6 (six) hours as needed for moderate pain.    Historical Provider, MD  Hydrocodone-Acetaminophen (VICODIN) 5-300 MG TABS Take 1 tablet by mouth every 4 (four) hours as needed. 09/04/15   Ena Dawley, MD  ibuprofen (ADVIL,MOTRIN) 800 MG tablet Take 1 tablet (800 mg total) by mouth every 8 (eight) hours as needed. 09/04/15   Ena Dawley, MD  promethazine (PHENERGAN) 12.5 MG tablet Take 1 tablet (12.5 mg total) by mouth every 6 (six) hours as needed for nausea or vomiting. 09/04/15   Ena Dawley, MD    Family History Family History  Problem Relation Age of Onset  . Hypertension Mother   . Hypertension Maternal Aunt   . Diabetes Maternal Aunt   . Hypertension Maternal Uncle   . Diabetes Maternal Uncle   . Diabetes Paternal Aunt   . Hypertension Paternal Aunt   . Cancer Paternal Aunt     breast  . Diabetes Paternal Uncle   . Hypertension Paternal Uncle   . Stroke Paternal Uncle   . Hypertension Maternal Grandmother   . Cancer Maternal Grandfather     prostate  . Hypertension Maternal  Grandfather   . Stroke Maternal Grandfather   . Diabetes Paternal Grandmother   . Stroke Paternal Grandfather   . Hypertension Paternal Grandfather     Social History Social History  Substance Use Topics  . Smoking status: Former Smoker    Packs/day: 0.25    Years: 8.00    Types: Cigarettes    Quit date: 08/09/2011  . Smokeless tobacco: Never Used  . Alcohol use 0.0 oz/week     Comment: occasional     Allergies   Review of patient's allergies indicates no known allergies.   Review of Systems Review of Systems  All other systems reviewed and are negative.    Physical Exam Updated Vital Signs BP 113/61 (BP Location: Left Arm)   Pulse 67   Temp 98.1 F (36.7 C) (Oral)   Resp 16    Ht 5' (1.524 m)   Wt 77.7 kg   LMP 08/07/2016   SpO2 99%   BMI 33.45 kg/m   Physical Exam  Constitutional: She is oriented to person, place, and time. She appears well-developed and well-nourished.  Non-toxic appearance. No distress.  HENT:  Head: Normocephalic and atraumatic.  Eyes: Conjunctivae, EOM and lids are normal. Pupils are equal, round, and reactive to light.  Neck: Normal range of motion. Neck supple. No tracheal deviation present. No thyroid mass present.  Cardiovascular: Normal rate, regular rhythm and normal heart sounds.  Exam reveals no gallop.   No murmur heard. Pulmonary/Chest: Effort normal and breath sounds normal. No stridor. No respiratory distress. She has no decreased breath sounds. She has no wheezes. She has no rhonchi. She has no rales. She exhibits tenderness.    Abdominal: Soft. Normal appearance and bowel sounds are normal. She exhibits no distension. There is no tenderness. There is no rebound and no CVA tenderness.  Musculoskeletal: Normal range of motion. She exhibits no edema or tenderness.  Neurological: She is alert and oriented to person, place, and time. She has normal strength. No cranial nerve deficit or sensory deficit. GCS eye subscore is 4. GCS verbal subscore is 5. GCS motor subscore is 6.  Skin: Skin is warm and dry. No abrasion and no rash noted.  Psychiatric: She has a normal mood and affect. Her speech is normal and behavior is normal.  Nursing note and vitals reviewed.    ED Treatments / Results  Labs (all labs ordered are listed, but only abnormal results are displayed) Labs Reviewed  BASIC METABOLIC PANEL  CBC  D-DIMER, QUANTITATIVE (NOT AT [facility])  Randolm Idol, ED    EKG  EKG Interpretation  Date/Time:  Sunday August 24 2016 08:47:03 EDT Ventricular Rate:  67 PR Interval:    QRS Duration: 82 QT Interval:  383 QTC Calculation: 405 R Axis:   84 Text Interpretation:  Sinus rhythm Confirmed by Omare Bilotta  MD, Isaias Dowson  ([id number]) on 08/24/2016 9:06:00 AM       Radiology No results found.  Procedures Procedures (including critical care time)  Medications Ordered in ED Medications  ketorolac (TORADOL) 30 MG/ML injection 30 mg (not administered)     Initial Impression / Assessment and Plan / ED Course  I have reviewed the triage vital signs and the nursing notes.  Pertinent labs & imaging results that were available during my care of the patient were reviewed by me and considered in my medical decision making (see chart for details).  Clinical Course    Patient given Toradol here and feels better. D-dimer negative. Chest x-ray without acute  findings. Suspect musculoskeletal chest pain is stable for discharge return precautions given  Final Clinical Impressions(s) / ED Diagnoses   Final diagnoses:  None    New Prescriptions New Prescriptions   No medications on file     Lacretia Leigh, MD 08/24/16 1053

## 2016-08-24 NOTE — ED Notes (Signed)
IV out by this Probation officer

## 2016-08-29 MED FILL — DOXEPIN 10 MG CAPSULE: 10 | 30 days supply | Qty: 30 | Fill #1

## 2016-10-22 MED FILL — LUPRON DEPOT 11.25 MG 3MO K: 11.25 | 30 days supply | Qty: 1 | Fill #0

## 2016-10-24 DIAGNOSIS — N809 Endometriosis, unspecified: Secondary | ICD-10-CM | POA: Diagnosis not present

## 2016-10-24 MED FILL — DOXEPIN 10 MG CAPSULE: 10 | 30 days supply | Qty: 30 | Fill #2

## 2016-12-12 MED FILL — DOXEPIN 10 MG CAPSULE: 10 | 30 days supply | Qty: 30 | Fill #3

## 2017-01-14 MED FILL — FLUoxetine HCL 20 MG TABS: 20 | 90 days supply | Qty: 90 | Fill #0

## 2017-01-14 MED FILL — DOXEPIN 10 MG CAPSULE: 10 | 30 days supply | Qty: 30 | Fill #0

## 2017-02-12 DIAGNOSIS — N809 Endometriosis, unspecified: Secondary | ICD-10-CM | POA: Diagnosis not present

## 2017-02-20 MED FILL — DOXEPIN 10 MG CAPSULE: 10 | 30 days supply | Qty: 30 | Fill #1

## 2017-04-03 MED FILL — DOXEPIN 10 MG CAPSULE: 10 | 30 days supply | Qty: 30 | Fill #2

## 2017-04-16 DIAGNOSIS — R102 Pelvic and perineal pain: Secondary | ICD-10-CM | POA: Diagnosis not present

## 2017-04-16 DIAGNOSIS — N809 Endometriosis, unspecified: Secondary | ICD-10-CM | POA: Diagnosis not present

## 2017-05-13 MED FILL — DOXEPIN 10 MG CAPSULE: 10 | 30 days supply | Qty: 30 | Fill #3

## 2017-05-20 DIAGNOSIS — N809 Endometriosis, unspecified: Secondary | ICD-10-CM | POA: Diagnosis not present

## 2017-06-15 DIAGNOSIS — N803 Endometriosis of pelvic peritoneum: Secondary | ICD-10-CM | POA: Diagnosis not present

## 2017-06-15 DIAGNOSIS — N870 Mild cervical dysplasia: Secondary | ICD-10-CM | POA: Diagnosis not present

## 2017-06-15 DIAGNOSIS — Z124 Encounter for screening for malignant neoplasm of cervix: Secondary | ICD-10-CM | POA: Diagnosis not present

## 2017-06-15 DIAGNOSIS — Z01411 Encounter for gynecological examination (general) (routine) with abnormal findings: Secondary | ICD-10-CM | POA: Diagnosis not present

## 2017-06-15 DIAGNOSIS — R102 Pelvic and perineal pain: Secondary | ICD-10-CM | POA: Diagnosis not present

## 2017-06-15 DIAGNOSIS — R87610 Atypical squamous cells of undetermined significance on cytologic smear of cervix (ASC-US): Secondary | ICD-10-CM | POA: Diagnosis not present

## 2017-06-15 LAB — HM PAP SMEAR

## 2017-06-15 MED FILL — DOXEPIN 10 MG CAPSULE: 10 | 30 days supply | Qty: 30 | Fill #4

## 2017-06-15 MED FILL — HYDROCODON-APAP 5-325: 5-325 | 5 days supply | Qty: 20 | Fill #0

## 2017-06-30 ENCOUNTER — Other Ambulatory Visit: Payer: Self-pay | Admitting: Obstetrics and Gynecology

## 2017-06-30 DIAGNOSIS — R87619 Unspecified abnormal cytological findings in specimens from cervix uteri: Secondary | ICD-10-CM | POA: Diagnosis not present

## 2017-06-30 DIAGNOSIS — R87610 Atypical squamous cells of undetermined significance on cytologic smear of cervix (ASC-US): Secondary | ICD-10-CM | POA: Diagnosis not present

## 2017-06-30 DIAGNOSIS — N870 Mild cervical dysplasia: Secondary | ICD-10-CM | POA: Diagnosis not present

## 2017-07-16 MED FILL — DOXEPIN 10 MG CAPSULE: 10 | 30 days supply | Qty: 30 | Fill #5

## 2017-07-23 MED FILL — LUPRON DEPOT 11.25 MG 3MO K: 11.25 | 30 days supply | Qty: 1 | Fill #0

## 2017-08-07 ENCOUNTER — Other Ambulatory Visit: Payer: Self-pay | Admitting: Obstetrics and Gynecology

## 2017-09-28 NOTE — Patient Instructions (Addendum)
Your procedure is scheduled on:  Monday, Dec 10   Enter through the Main Entrance of [facility] at:  6 am  Pick up the phone at the desk and dial [phone].  Call this number if you have problems the morning of surgery: [phone].  Remember: Do NOT eat food or Do NOT drink clear liquids (including water) after Midnight Sunday  Take these medicines the morning of surgery with a SIP OF WATER:  None  Stop herbal medications and supplements at this time.  Do NOT wear jewelry (body piercing), metal hair clips/bobby pins, make-up, or nail polish. Do NOT wear lotions, powders, or perfumes.  You may wear deoderant. Do NOT shave for 48 hours prior to surgery. Do NOT bring valuables to the hospital.  Leave suitcase in car.  After surgery it may be brought to your room.  For patients admitted to the hospital, checkout time is 11:00 AM the day of discharge. Have a responsible adult drive you home and stay with you for 24 hours after your procedure.  Home with Mother Mertice cell [phone].

## 2017-10-01 DIAGNOSIS — Z01818 Encounter for other preprocedural examination: Secondary | ICD-10-CM | POA: Diagnosis not present

## 2017-10-01 DIAGNOSIS — N803 Endometriosis of pelvic peritoneum: Secondary | ICD-10-CM | POA: Diagnosis not present

## 2017-10-01 DIAGNOSIS — Z139 Encounter for screening, unspecified: Secondary | ICD-10-CM | POA: Diagnosis not present

## 2017-10-01 DIAGNOSIS — R102 Pelvic and perineal pain: Secondary | ICD-10-CM | POA: Diagnosis not present

## 2017-10-02 ENCOUNTER — Other Ambulatory Visit: Payer: Self-pay

## 2017-10-02 ENCOUNTER — Encounter (HOSPITAL_COMMUNITY)
Admission: RE | Admit: 2017-10-02 | Discharge: 2017-10-02 | Disposition: A | Discharge status: Home / Self Care | Payer: 59 | Source: Ambulatory Visit | Attending: Obstetrics and Gynecology | Admitting: Obstetrics and Gynecology

## 2017-10-02 ENCOUNTER — Encounter (HOSPITAL_COMMUNITY): Payer: Self-pay

## 2017-10-02 DIAGNOSIS — Z01812 Encounter for preprocedural laboratory examination: Secondary | ICD-10-CM | POA: Diagnosis not present

## 2017-10-02 DIAGNOSIS — R102 Pelvic and perineal pain: Secondary | ICD-10-CM | POA: Diagnosis not present

## 2017-10-02 HISTORY — DX: Major depressive disorder, single episode, unspecified: F32.9

## 2017-10-02 HISTORY — DX: Herpesviral infection, unspecified: B00.9

## 2017-10-02 HISTORY — DX: Thalassemia minor: D56.3

## 2017-10-02 HISTORY — DX: Depression, unspecified: F32.A

## 2017-10-02 LAB — CBC
HEMATOCRIT: 36.9 % (ref 36.0–46.0)
HEMOGLOBIN: 11.6 g/dL — AB (ref 12.0–15.0)
MCH: 22.5 pg — ABNORMAL LOW (ref 26.0–34.0)
MCHC: 31.4 g/dL (ref 30.0–36.0)
MCV: 71.5 fL — ABNORMAL LOW (ref 78.0–100.0)
Platelets: 325 10*3/uL (ref 150–400)
RBC: 5.16 MIL/uL — ABNORMAL HIGH (ref 3.87–5.11)
RDW: 15.6 % — AB (ref 11.5–15.5)
WBC: 6.9 10*3/uL (ref 4.0–10.5)

## 2017-10-10 ENCOUNTER — Encounter (HOSPITAL_COMMUNITY): Payer: Self-pay | Admitting: Anesthesiology

## 2017-10-11 NOTE — H&P (Signed)
Admission History and Physical Exam for a Gynecology Patient  Elizabeth Benson is a 36 y.o. female, [phone], who presents for LAVH and bilateral salpingectomy. She has been followed at the [facility] division of [facility]. She has stage 1 endometriosis, CIN 1, HR HPV, possible adenomyosis, and pelvic pain that has been uncontrolled in spite of Lupron, hormone therapy, and po pain meds. She has had multiple laparoscopies with ablation of endometriosis.  OB History    Gravida Para Term Preterm AB Living   4 2     1 2    SAB TAB Ectopic Multiple Live Births   1              Past Medical History:  Diagnosis Date  . Anxiety    no meds  . Depression    no meds  . Endometriosis   . History of cervical dysplasia    CIN I  . HSV infection   . Pelvic pain in female   . PONV (postoperative nausea and vomiting)   . SVD (spontaneous vaginal delivery)    x 2  . Thalassemia minor   . Thalassemia trait   . Wears glasses     No medications prior to admission.    Past Surgical History:  Procedure Laterality Date  . LAPAROSCOPY  2002   for endometriosis  . LAPAROSCOPY  08/06/2012   Procedure: LAPAROSCOPY OPERATIVE;  Surgeon: Ena Dawley, MD;  Location: [facility];  Service: Gynecology;  Laterality: N/A;  Laparoscopic Pelvic Biopsies  . LAPAROSCOPY N/A 09/04/2015   Procedure: LAPAROSCOPY OPERATIVE with peritoneal biopsies;  Surgeon: Ena Dawley, MD;  Location: [facility];  Service: Gynecology;  Laterality: N/A;  . WISDOM TOOTH EXTRACTION  1990's    No Known Allergies  Family History: family history includes Cancer in her maternal grandfather and paternal aunt; Diabetes in her maternal aunt, maternal uncle, paternal aunt, paternal grandmother, and paternal uncle; Hypertension in her maternal aunt, maternal grandfather, maternal grandmother, maternal uncle, mother, paternal aunt, paternal grandfather, and paternal  uncle; Stroke in her maternal grandfather, paternal grandfather, and paternal uncle.  Social History:  reports that she quit smoking about 6 years ago. Her smoking use included cigarettes. She has a 2.00 pack-year smoking history. she has never used smokeless tobacco. She reports that she drinks alcohol. She reports that she does not use drugs.  Review of systems: See HPI.  Admission Physical Exam:    BMI is 35.   There were no vitals taken for this visit.  HEENT:                 Within normal limits Chest:                   Clear Heart:                    Regular rate and rhythm Breasts:                No masses, skin changes, bleeding, or discharge present Abdomen:             Nontender, no masses Extremities:          Grossly normal Neurologic exam: Grossly normal  Pelvic exam:  External genitalia: normal general appearance Vaginal: cystocele Cervix: CIN 1 on colposcopy Adnexa: Tender Uterus: ULNS, tender  Assessment:  Pelvic pain  Endometriosis  CIN 1  HPV  Possible adenomyosis  BMI = 35  Plan:  LAVH and bilateral salpingectomy   Eli Hose 10/11/2017

## 2017-10-12 ENCOUNTER — Other Ambulatory Visit: Payer: Self-pay

## 2017-10-12 ENCOUNTER — Ambulatory Visit (HOSPITAL_COMMUNITY): Payer: 59 | Admitting: Anesthesiology

## 2017-10-12 ENCOUNTER — Encounter (HOSPITAL_COMMUNITY): Payer: Self-pay

## 2017-10-12 ENCOUNTER — Observation Stay (HOSPITAL_COMMUNITY)
Admission: AD | Admit: 2017-10-12 | Discharge: 2017-10-14 | Disposition: A | Discharge status: Home / Self Care | Payer: 59 | Source: Ambulatory Visit | Attending: Obstetrics and Gynecology | Admitting: Obstetrics and Gynecology

## 2017-10-12 ENCOUNTER — Encounter (HOSPITAL_COMMUNITY)
Admission: AD | Disposition: A | Discharge status: Home / Self Care | Payer: Self-pay | Source: Ambulatory Visit | Attending: Obstetrics and Gynecology

## 2017-10-12 DIAGNOSIS — N870 Mild cervical dysplasia: Secondary | ICD-10-CM | POA: Diagnosis not present

## 2017-10-12 DIAGNOSIS — F419 Anxiety disorder, unspecified: Secondary | ICD-10-CM | POA: Diagnosis not present

## 2017-10-12 DIAGNOSIS — N800 Endometriosis of uterus: Secondary | ICD-10-CM | POA: Diagnosis not present

## 2017-10-12 DIAGNOSIS — D259 Leiomyoma of uterus, unspecified: Secondary | ICD-10-CM | POA: Diagnosis not present

## 2017-10-12 DIAGNOSIS — R338 Other retention of urine: Secondary | ICD-10-CM | POA: Insufficient documentation

## 2017-10-12 DIAGNOSIS — N736 Female pelvic peritoneal adhesions (postinfective): Secondary | ICD-10-CM | POA: Insufficient documentation

## 2017-10-12 DIAGNOSIS — N72 Inflammatory disease of cervix uteri: Secondary | ICD-10-CM | POA: Diagnosis not present

## 2017-10-12 DIAGNOSIS — D563 Thalassemia minor: Secondary | ICD-10-CM | POA: Diagnosis not present

## 2017-10-12 DIAGNOSIS — R87810 Cervical high risk human papillomavirus (HPV) DNA test positive: Secondary | ICD-10-CM | POA: Diagnosis not present

## 2017-10-12 DIAGNOSIS — F329 Major depressive disorder, single episode, unspecified: Secondary | ICD-10-CM | POA: Insufficient documentation

## 2017-10-12 DIAGNOSIS — N809 Endometriosis, unspecified: Secondary | ICD-10-CM | POA: Diagnosis not present

## 2017-10-12 DIAGNOSIS — Z87891 Personal history of nicotine dependence: Secondary | ICD-10-CM | POA: Insufficient documentation

## 2017-10-12 DIAGNOSIS — N926 Irregular menstruation, unspecified: Secondary | ICD-10-CM | POA: Diagnosis not present

## 2017-10-12 DIAGNOSIS — Z0100 Encounter for examination of eyes and vision without abnormal findings: Secondary | ICD-10-CM | POA: Diagnosis not present

## 2017-10-12 DIAGNOSIS — R102 Pelvic and perineal pain: Secondary | ICD-10-CM | POA: Diagnosis not present

## 2017-10-12 DIAGNOSIS — D649 Anemia, unspecified: Secondary | ICD-10-CM | POA: Diagnosis not present

## 2017-10-12 DIAGNOSIS — G43901 Migraine, unspecified, not intractable, with status migrainosus: Secondary | ICD-10-CM | POA: Diagnosis not present

## 2017-10-12 DIAGNOSIS — D251 Intramural leiomyoma of uterus: Secondary | ICD-10-CM | POA: Diagnosis not present

## 2017-10-12 HISTORY — PX: LAPAROSCOPIC VAGINAL HYSTERECTOMY WITH SALPINGECTOMY: SHX6680

## 2017-10-12 LAB — CBC
HEMATOCRIT: 32.3 % — AB (ref 36.0–46.0)
HEMOGLOBIN: 10.2 g/dL — AB (ref 12.0–15.0)
MCH: 22.6 pg — ABNORMAL LOW (ref 26.0–34.0)
MCHC: 31.6 g/dL (ref 30.0–36.0)
MCV: 71.6 fL — AB (ref 78.0–100.0)
Platelets: 271 10*3/uL (ref 150–400)
RBC: 4.51 MIL/uL (ref 3.87–5.11)
RDW: 15.6 % — AB (ref 11.5–15.5)
WBC: 12 10*3/uL — AB (ref 4.0–10.5)

## 2017-10-12 LAB — PREGNANCY, URINE: Preg Test, Ur: NEGATIVE

## 2017-10-12 SURGERY — HYSTERECTOMY, VAGINAL, LAPAROSCOPY-ASSISTED, WITH SALPINGECTOMY
Anesthesia: General | Site: Vagina | Laterality: Bilateral

## 2017-10-12 MED ORDER — OXYCODONE HCL 5 MG PO TABS: 5.0000 mg | ORAL_TABLET | Freq: Once | ORAL | Status: DC | PRN

## 2017-10-12 MED ORDER — ONDANSETRON HCL 4 MG PO TABS
4.0000 mg | ORAL_TABLET | Freq: Four times a day (QID) | ORAL | Status: DC | PRN
  Administered 2017-10-12: 4 mg via ORAL
  Filled 2017-10-12: qty 1

## 2017-10-12 MED ORDER — SODIUM CHLORIDE 0.9 % IJ SOLN
INTRAMUSCULAR | Status: DC | PRN
  Administered 2017-10-12: 30 mL via INTRAVENOUS

## 2017-10-12 MED ORDER — ROCURONIUM BROMIDE 100 MG/10ML IV SOLN
INTRAVENOUS | Status: AC
  Filled 2017-10-12: qty 1

## 2017-10-12 MED ORDER — SCOPOLAMINE 1 MG/3DAYS TD PT72
MEDICATED_PATCH | TRANSDERMAL | Status: AC
  Administered 2017-10-12: 1.5 mg via TRANSDERMAL
  Filled 2017-10-12: qty 1

## 2017-10-12 MED ORDER — DEXAMETHASONE SODIUM PHOSPHATE 10 MG/ML IJ SOLN
INTRAMUSCULAR | Status: AC
  Filled 2017-10-12: qty 1

## 2017-10-12 MED ORDER — BUPIVACAINE-EPINEPHRINE 0.5% -1:200000 IJ SOLN
INTRAMUSCULAR | Status: DC | PRN
  Administered 2017-10-12: 10 mL

## 2017-10-12 MED ORDER — SODIUM CHLORIDE 0.9 % IJ SOLN
INTRAMUSCULAR | Status: AC
  Filled 2017-10-12: qty 10

## 2017-10-12 MED ORDER — DEXAMETHASONE SODIUM PHOSPHATE 10 MG/ML IJ SOLN
INTRAMUSCULAR | Status: DC | PRN
  Administered 2017-10-12: 10 mg via INTRAVENOUS

## 2017-10-12 MED ORDER — PROPOFOL 10 MG/ML IV BOLUS
INTRAVENOUS | Status: AC
  Filled 2017-10-12: qty 20

## 2017-10-12 MED ORDER — LIDOCAINE HCL (CARDIAC) 20 MG/ML IV SOLN
INTRAVENOUS | Status: AC
  Filled 2017-10-12: qty 5

## 2017-10-12 MED ORDER — NEOSTIGMINE METHYLSULFATE 10 MG/10ML IV SOLN
INTRAVENOUS | Status: AC
  Filled 2017-10-12: qty 1

## 2017-10-12 MED ORDER — CEFAZOLIN SODIUM-DEXTROSE 2-3 GM-%(50ML) IV SOLR
INTRAVENOUS | Status: AC
  Filled 2017-10-12: qty 50

## 2017-10-12 MED ORDER — CEFAZOLIN SODIUM-DEXTROSE 2-4 GM/100ML-% IV SOLN
2.0000 g | INTRAVENOUS | Status: AC
  Administered 2017-10-12: 2 g via INTRAVENOUS
  Filled 2017-10-12: qty 100

## 2017-10-12 MED ORDER — VASOPRESSIN 20 UNIT/ML IV SOLN
INTRAVENOUS | Status: AC
  Filled 2017-10-12: qty 1

## 2017-10-12 MED ORDER — DEXTROSE IN LACTATED RINGERS 5 % IV SOLN
INTRAVENOUS | Status: AC
  Administered 2017-10-12: 15:00:00 via INTRAVENOUS

## 2017-10-12 MED ORDER — LIDOCAINE HCL (CARDIAC) 20 MG/ML IV SOLN
INTRAVENOUS | Status: DC | PRN
  Administered 2017-10-12: 60 mg via INTRAVENOUS

## 2017-10-12 MED ORDER — LIDOCAINE HCL 1 % IJ SOLN
INTRAMUSCULAR | Status: AC
  Filled 2017-10-12: qty 20

## 2017-10-12 MED ORDER — VASOPRESSIN 20 UNIT/ML IV SOLN
INTRAVENOUS | Status: DC | PRN
  Administered 2017-10-12: 30 mL via INTRAMUSCULAR

## 2017-10-12 MED ORDER — SUGAMMADEX SODIUM 200 MG/2ML IV SOLN
INTRAVENOUS | Status: DC | PRN
  Administered 2017-10-12: 200 mg via INTRAVENOUS

## 2017-10-12 MED ORDER — BUPIVACAINE LIPOSOME 1.3 % IJ SUSP
20.0000 mL | Freq: Once | INTRAMUSCULAR | Status: AC
  Administered 2017-10-12: 20 mL
  Filled 2017-10-12: qty 20

## 2017-10-12 MED ORDER — OXYCODONE HCL 5 MG/5ML PO SOLN: 5.0000 mg | Freq: Once | ORAL | Status: DC | PRN

## 2017-10-12 MED ORDER — SUGAMMADEX SODIUM 200 MG/2ML IV SOLN
INTRAVENOUS | Status: AC
  Filled 2017-10-12: qty 2

## 2017-10-12 MED ORDER — FENTANYL CITRATE (PF) 100 MCG/2ML IJ SOLN
INTRAMUSCULAR | Status: AC
  Filled 2017-10-12: qty 2

## 2017-10-12 MED ORDER — SODIUM CHLORIDE 0.9 % IJ SOLN
INTRAMUSCULAR | Status: AC
  Filled 2017-10-12: qty 100

## 2017-10-12 MED ORDER — KETOROLAC TROMETHAMINE 30 MG/ML IJ SOLN
INTRAMUSCULAR | Status: AC
  Filled 2017-10-12: qty 1

## 2017-10-12 MED ORDER — LACTATED RINGERS IV SOLN
INTRAVENOUS | Status: DC
  Administered 2017-10-12: 125 mL/h via INTRAVENOUS
  Administered 2017-10-12: 08:00:00 via INTRAVENOUS

## 2017-10-12 MED ORDER — MIDAZOLAM HCL 2 MG/2ML IJ SOLN
INTRAMUSCULAR | Status: DC | PRN
  Administered 2017-10-12: 1.5 mg via INTRAVENOUS
  Administered 2017-10-12: 0.5 mg via INTRAVENOUS

## 2017-10-12 MED ORDER — ONDANSETRON HCL 4 MG/2ML IJ SOLN: 4.0000 mg | Freq: Four times a day (QID) | INTRAMUSCULAR | Status: DC | PRN

## 2017-10-12 MED ORDER — ONDANSETRON HCL 4 MG/2ML IJ SOLN
INTRAMUSCULAR | Status: DC | PRN
  Administered 2017-10-12 (×2): 2 mg via INTRAVENOUS

## 2017-10-12 MED ORDER — FENTANYL CITRATE (PF) 250 MCG/5ML IJ SOLN
INTRAMUSCULAR | Status: AC
  Filled 2017-10-12: qty 5

## 2017-10-12 MED ORDER — GLYCOPYRROLATE 0.2 MG/ML IJ SOLN
INTRAMUSCULAR | Status: DC | PRN
  Administered 2017-10-12 (×2): 0.1 mg via INTRAVENOUS

## 2017-10-12 MED ORDER — OXYCODONE-ACETAMINOPHEN 5-325 MG PO TABS
1.0000 | ORAL_TABLET | ORAL | Status: DC | PRN
  Administered 2017-10-12 – 2017-10-14 (×5): 1 via ORAL
  Filled 2017-10-12: qty 2
  Filled 2017-10-12 (×5): qty 1

## 2017-10-12 MED ORDER — MIDAZOLAM HCL 2 MG/2ML IJ SOLN
INTRAMUSCULAR | Status: AC
  Filled 2017-10-12: qty 2

## 2017-10-12 MED ORDER — ROCURONIUM BROMIDE 100 MG/10ML IV SOLN
INTRAVENOUS | Status: DC | PRN
  Administered 2017-10-12: 50 mg via INTRAVENOUS
  Administered 2017-10-12: 10 mg via INTRAVENOUS

## 2017-10-12 MED ORDER — KETOROLAC TROMETHAMINE 30 MG/ML IJ SOLN
INTRAMUSCULAR | Status: DC | PRN
  Administered 2017-10-12: 30 mg via INTRAVENOUS

## 2017-10-12 MED ORDER — OXYCODONE-ACETAMINOPHEN 5-325 MG PO TABS
1.0000 | ORAL_TABLET | ORAL | Status: DC | PRN
  Administered 2017-10-14: 1 via ORAL

## 2017-10-12 MED ORDER — KETOROLAC TROMETHAMINE 30 MG/ML IJ SOLN
30.0000 mg | Freq: Four times a day (QID) | INTRAMUSCULAR | Status: DC
  Administered 2017-10-12 – 2017-10-13 (×3): 30 mg via INTRAVENOUS
  Filled 2017-10-12 (×4): qty 1

## 2017-10-12 MED ORDER — FENTANYL CITRATE (PF) 100 MCG/2ML IJ SOLN
INTRAMUSCULAR | Status: DC | PRN
Start: 2017-10-12 — End: 2017-10-14
  Administered 2017-10-12: 100 ug via INTRAVENOUS
  Administered 2017-10-12 (×3): 50 ug via INTRAVENOUS

## 2017-10-12 MED ORDER — BUPIVACAINE-EPINEPHRINE (PF) 0.5% -1:200000 IJ SOLN
INTRAMUSCULAR | Status: AC
  Filled 2017-10-12: qty 30

## 2017-10-12 MED ORDER — ONDANSETRON HCL 4 MG/2ML IJ SOLN
INTRAMUSCULAR | Status: AC
  Filled 2017-10-12: qty 2

## 2017-10-12 MED ORDER — HYDROMORPHONE HCL 1 MG/ML IJ SOLN
0.2500 mg | INTRAMUSCULAR | Status: DC | PRN
  Administered 2017-10-12: 0.5 mg via INTRAVENOUS

## 2017-10-12 MED ORDER — PROPOFOL 10 MG/ML IV BOLUS
INTRAVENOUS | Status: DC | PRN
  Administered 2017-10-12: 150 mg via INTRAVENOUS

## 2017-10-12 MED ORDER — HYDROMORPHONE HCL 1 MG/ML IJ SOLN
INTRAMUSCULAR | Status: DC | PRN
  Administered 2017-10-12: 1 mg via INTRAVENOUS

## 2017-10-12 MED ORDER — SIMETHICONE 80 MG PO CHEW
80.0000 mg | CHEWABLE_TABLET | Freq: Four times a day (QID) | ORAL | Status: DC | PRN
  Administered 2017-10-14: 80 mg via ORAL
  Filled 2017-10-12: qty 1

## 2017-10-12 MED ORDER — GLYCOPYRROLATE 0.2 MG/ML IJ SOLN
INTRAMUSCULAR | Status: AC
  Filled 2017-10-12: qty 1

## 2017-10-12 MED ORDER — SODIUM CHLORIDE 0.9 % IJ SOLN
INTRAMUSCULAR | Status: AC
  Filled 2017-10-12: qty 50

## 2017-10-12 MED ORDER — SCOPOLAMINE 1 MG/3DAYS TD PT72
1.0000 | MEDICATED_PATCH | Freq: Once | TRANSDERMAL | Status: DC
  Administered 2017-10-12: 1.5 mg via TRANSDERMAL

## 2017-10-12 MED ORDER — IBUPROFEN 800 MG PO TABS
800.0000 mg | ORAL_TABLET | Freq: Three times a day (TID) | ORAL | Status: DC | PRN
  Administered 2017-10-13 – 2017-10-14 (×2): 800 mg via ORAL
  Filled 2017-10-12 (×2): qty 1

## 2017-10-12 MED ORDER — KETOROLAC TROMETHAMINE 30 MG/ML IJ SOLN: 30.0000 mg | Freq: Four times a day (QID) | INTRAMUSCULAR | Status: DC

## 2017-10-12 MED ORDER — HYDROMORPHONE HCL 1 MG/ML IJ SOLN
INTRAMUSCULAR | Status: AC
  Filled 2017-10-12: qty 1

## 2017-10-12 MED ORDER — HYDROMORPHONE HCL 1 MG/ML IJ SOLN
INTRAMUSCULAR | Status: AC
  Filled 2017-10-12: qty 0.5

## 2017-10-12 SURGICAL SUPPLY — 50 items
CABLE HIGH FREQUENCY MONO STRZ (ELECTRODE) IMPLANT
CONT PATH 16OZ SNAP LID 3702 (MISCELLANEOUS) ×2 IMPLANT
COVER BACK TABLE 60X90IN (DRAPES) ×2 IMPLANT
COVER MAYO STAND STRL (DRAPES) ×1 IMPLANT
DECANTER SPIKE VIAL GLASS SM (MISCELLANEOUS) ×7 IMPLANT
DRSG OPSITE POSTOP 3X4 (GAUZE/BANDAGES/DRESSINGS) ×2 IMPLANT
DRSG TELFA 3X8 NADH (GAUZE/BANDAGES/DRESSINGS) ×2 IMPLANT
DURAPREP 26ML APPLICATOR (WOUND CARE) ×2 IMPLANT
ELECT REM PT RETURN 9FT ADLT (ELECTROSURGICAL) ×2
ELECTRODE REM PT RTRN 9FT ADLT (ELECTROSURGICAL) ×1 IMPLANT
GAUZE SPONGE 4X4 16PLY XRAY LF (GAUZE/BANDAGES/DRESSINGS) ×2 IMPLANT
GAUZE VASELINE 3X9 (GAUZE/BANDAGES/DRESSINGS) IMPLANT
GLOVE BIOGEL PI IND STRL 6.5 (GLOVE) ×1 IMPLANT
GLOVE BIOGEL PI IND STRL 7.0 (GLOVE) ×2 IMPLANT
GLOVE BIOGEL PI IND STRL 8.5 (GLOVE) ×3 IMPLANT
GLOVE BIOGEL PI INDICATOR 6.5 (GLOVE) ×1
GLOVE BIOGEL PI INDICATOR 7.0 (GLOVE) ×2
GLOVE BIOGEL PI INDICATOR 8.5 (GLOVE) ×3
GLOVE ECLIPSE 8.0 STRL XLNG CF (GLOVE) ×6 IMPLANT
LEGGING LITHOTOMY PAIR STRL (DRAPES) ×2 IMPLANT
NDL MAYO CATGUT SZ4 TPR NDL (NEEDLE) ×1 IMPLANT
NEEDLE MAYO CATGUT SZ4 (NEEDLE) ×2 IMPLANT
NS IRRIG 1000ML POUR BTL (IV SOLUTION) ×2 IMPLANT
PACK LAVH (CUSTOM PROCEDURE TRAY) ×2 IMPLANT
PACK ROBOTIC GOWN (GOWN DISPOSABLE) ×2 IMPLANT
PACK TRENDGUARD 450 HYBRID PRO (MISCELLANEOUS) IMPLANT
PACK TRENDGUARD 600 HYBRD PROC (MISCELLANEOUS) IMPLANT
PAD DRESSING TELFA 3X8 NADH (GAUZE/BANDAGES/DRESSINGS) ×1 IMPLANT
PENCIL SMOKE EVAC W/HOLSTER (ELECTROSURGICAL) ×1 IMPLANT
PROTECTOR NERVE ULNAR (MISCELLANEOUS) ×4 IMPLANT
SET IRRIG TUBING LAPAROSCOPIC (IRRIGATION / IRRIGATOR) IMPLANT
SOLUTION ELECTROLUBE (MISCELLANEOUS) ×2 IMPLANT
SUT CHROMIC 1 CT1 27 (SUTURE) IMPLANT
SUT MNCRL AB 3-0 PS2 27 (SUTURE) ×2 IMPLANT
SUT VIC AB 0 CT1 18XCR BRD8 (SUTURE) ×3 IMPLANT
SUT VIC AB 0 CT1 27 (SUTURE) ×6
SUT VIC AB 0 CT1 27XBRD ANBCTR (SUTURE) ×3 IMPLANT
SUT VIC AB 0 CT1 8-18 (SUTURE) ×4
SUT VIC AB 2-0 SH 27 (SUTURE) ×2
SUT VIC AB 2-0 SH 27XBRD (SUTURE) ×1 IMPLANT
SUT VICRYL 0 ENDOLOOP (SUTURE) IMPLANT
SUT VICRYL 0 TIES 12 18 (SUTURE) ×2 IMPLANT
SUT VICRYL 0 UR6 27IN ABS (SUTURE) ×4 IMPLANT
SYR 50ML LL SCALE MARK (SYRINGE) IMPLANT
SYR TB 1ML 25GX5/8 (SYRINGE) ×2 IMPLANT
TOWEL OR 17X24 6PK STRL BLUE (TOWEL DISPOSABLE) ×4 IMPLANT
TRAY FOLEY CATH SILVER 14FR (SET/KITS/TRAYS/PACK) ×2 IMPLANT
TRENDGUARD 450 HYBRID PRO PACK (MISCELLANEOUS) ×2
TRENDGUARD 600 HYBRID PROC PK (MISCELLANEOUS)
WARMER LAPAROSCOPE (MISCELLANEOUS) ×2 IMPLANT

## 2017-10-12 NOTE — Op Note (Signed)
OPERATIVE NOTE  Elizabeth Benson  DOB:    [birth year]  MRN:    [id number]  CSN:    [id number]  Date of Surgery:  10/12/2017  Preoperative Diagnosis:  Pelvic pain  Stage I endometriosis  Possible adenomyosis  Anemia  CIN-1  High risk HPV  Postoperative Diagnosis:  Same  Procedure:  Laparoscopy  Vaginal hysterectomy  Bilateral salpingectomy  Surgeon:  Gildardo Cranker, M.D.  Assistant:  Kendall Flack, MD  Anesthetic:  General  Disposition:  The patient presents with the above-mentioned diagnosis. She understands the indications for surgical procedure.  She also understands the alternative treatment options. She accepts the risk of, but not limited to, anesthetic complications, bleeding, infections, and possible damage to the surrounding organs.  Findings:  The uterus is upper limits normal size and slightly boggy.  The fallopian tubes appeared normal.  The ovaries appeared normal.  There were several hyperpigmented lesions in the posterior cul-de-sac consistent with stage I endometriosis.  The upper abdomen appeared normal.  The appendix and the bowel appeared normal.  There were filmy adhesions between the large bowel in the right pelvic sidewall.  There were adhesions between the omentum and the anterior abdominal wall.  Procedure:  The patient was taken to the operating room where a general anesthetic was given. The patient's  abdomen was prepped with ChloraPrep. The perineum and vagina were prepped with multiple layers of Betadine. A Foley catheter was placed in the bladder. An examination under anesthesia was performed. A Hulka tenaculum was placed inside the uterus. The patient was sterilely draped. The subumbilical area was injected with half percent Marcaine with epinephrine. An incision was made and carried sharply through the subcutaneous tissue, the fascia, and the anterior peritoneum.  The Hassan cannula was sutured into place.  The  laparoscope was inserted.  The pelvis was visualized with findings as mentioned above. The mid abdomen was injected with half percent Marcaine with epinephrine to the right of the midline. A small incision was made and a 5 mm trocar was inserted into the abdominal cavity under direct visualization. Pictures were taken of the patient's pelvic structures.  The posterior and anterior cul-de-sacs were free of adhesions.  We felt that we could appropriately proceed with vaginal hysterectomy at this time. The patient was placed in a more lithotomy position. A weighted speculum was placed in the posterior vagina. The cervix was injected with half percent Marcaine with epinephrine. A circumferential incision was made around the cervix. The vaginal mucosa was advanced anteriorly and posteriorly. The anterior cul-de-sac and in the posterior cul-de-sac were sharply entered. Alternating from right to left the uterosacral ligaments, paracervical tissues, parametrial tissues, and uterine arteries were clamped, cut, sutured, and tied securely.  The upper pedicles were then clamped and cut. The uterus was removed from the operative field. Hemostasis was confirmed.  The mesosalpinx on the left was then clamped and cut.  2 free ties were placed.  An identical procedure was carried out on the right.  A stitch was placed in the right tube. The sutures attached to the uterosacral ligaments were brought out through the vaginal angles and then tied securely. A McCall culdoplasty suture was placed in the posterior cul-de-sac incorporating the uterosacral ligaments bilaterally and the posterior peritoneum. A final check was made for hemostasis and again hemostasis was confirmed. Exparel was injected into the suture lines. The vaginal cuff was closed using figure-of-eight sutures incorporating the anterior vaginal mucosa, the anterior peritoneum, posterior peritoneum, and the posterior  vaginal mucosa. The McCall culdoplasty suture was tied  securely and the apex of the vagina was noted to elevate into the midpelvis. The operator then changed gown and gloves. The pneumoperitoneum was reestablished. The pelvis was inspected and hemostasis was adequate. The pelvis was irrigated. We felt that we are ready to end the procedure. The 5 mm trochar was removed under direct visualization. The pneumoperitoneum was allowed to escape. The subumbilical trocar was removed. The subumbilical incision was closed using a deep suture of 0 Vicryl followed by skin closures of 3-0 Monocryl. The patient tolerated her procedure well. She was awakened from her anesthetic without difficulty and then transported to the recovery room in stable condition. Sponge, needle, and instrument counts were correct on 2 occasions. The estimated blood loss was 75 cc's. 0 Vicryl is the suture material used throughout the procedure. The uterus was sent to pathology.   Gildardo Cranker, M.D.  October 12, 2017 10:36 AM

## 2017-10-12 NOTE — Transfer of Care (Signed)
Immediate Anesthesia Transfer of Care Note  Patient: Elizabeth Benson  Procedure(s) Performed: LAPAROSCOPy, VAGINAL HYSTERECTOMY WITH BILATERAL  SALPINGECTOMY (Bilateral Vagina )  Patient Location: PACU  Anesthesia Type:General  Level of Consciousness: awake, alert  and oriented  Airway & Oxygen Therapy: Patient Spontanous Breathing and Patient connected to nasal cannula oxygen  Post-op Assessment: Report given to RN, Post -op Vital signs reviewed and stable and Patient moving all extremities X 4  Post vital signs: Reviewed and stable  Last Vitals:  Vitals:   10/12/17 0612  BP: 114/75  Pulse: 73  Resp: 16  Temp: 36.6 C  SpO2: 97%    Last Pain:  Vitals:   10/12/17 0612  TempSrc: Oral  PainSc: 7       Patients Stated Pain Goal: 2 (59/47/07 6151)  Complications: No apparent anesthesia complications

## 2017-10-12 NOTE — Anesthesia Postprocedure Evaluation (Signed)
Anesthesia Post Note  Patient: Elizabeth Benson  Procedure(s) Performed: LAPAROSCOPy, VAGINAL HYSTERECTOMY WITH BILATERAL  SALPINGECTOMY (Bilateral Vagina )     Patient location during evaluation: [facility] Anesthesia Type: General Level of consciousness: awake and alert Pain management: pain level controlled Vital Signs Assessment: post-procedure vital signs reviewed and stable Respiratory status: spontaneous breathing, nonlabored ventilation, respiratory function stable and patient connected to nasal cannula oxygen Cardiovascular status: blood pressure returned to baseline and stable Postop Assessment: no apparent nausea or vomiting Anesthetic complications: no    Last Vitals:  Vitals:   10/12/17 1259 10/12/17 1636  BP: (!) 118/53 (!) 107/58  Pulse: 69 65  Resp: 19 20  Temp: 36.6 C 36.6 C  SpO2: 93% 95%    Last Pain:  Vitals:   10/12/17 1636  TempSrc: Oral  PainSc:    Pain Goal: Patients Stated Pain Goal: 2 (10/12/17 0612)               Gilmer Mor

## 2017-10-12 NOTE — Anesthesia Procedure Notes (Signed)
Procedure Name: Intubation Date/Time: 10/12/2017 7:30 AM Performed by: Albertha Ghee, MD Pre-anesthesia Checklist: Patient identified, Patient being monitored, Timeout performed, Emergency Drugs available and Suction available Patient Re-evaluated:Patient Re-evaluated prior to induction Oxygen Delivery Method: Circle System Utilized Preoxygenation: Pre-oxygenation with 100% oxygen Induction Type: IV induction Ventilation: Mask ventilation without difficulty Laryngoscope Size: 3 and Miller Grade View: Grade II Tube type: Oral Tube size: 7.0 mm Number of attempts: 1 Airway Equipment and Method: stylet Placement Confirmation: ETT inserted through vocal cords under direct vision,  positive ETCO2 and breath sounds checked- equal and bilateral Secured at: 21 cm Tube secured with: Tape Dental Injury: Teeth and Oropharynx as per pre-operative assessment

## 2017-10-12 NOTE — Anesthesia Preprocedure Evaluation (Signed)
Anesthesia Evaluation  Patient identified by MRN, date of birth, ID band Patient awake    Reviewed: Allergy & Precautions, H&P , NPO status , Patient's Chart, lab work & pertinent test results  History of Anesthesia Complications (+) PONV and history of anesthetic complications  Airway Mallampati: II   Neck ROM: full    Dental   Pulmonary former smoker,    breath sounds clear to auscultation       Cardiovascular negative cardio ROS   Rhythm:regular Rate:Normal     Neuro/Psych PSYCHIATRIC DISORDERS Anxiety Depression    GI/Hepatic   Endo/Other    Renal/GU      Musculoskeletal   Abdominal   Peds  Hematology  (+) Blood dyscrasia, , thalassemia   Anesthesia Other Findings   Reproductive/Obstetrics                             Anesthesia Physical Anesthesia Plan  ASA: II  Anesthesia Plan: General   Post-op Pain Management:    Induction: Intravenous  PONV Risk Score and Plan: 4 or greater and Ondansetron, Dexamethasone, Midazolam, Scopolamine patch - Pre-op and Treatment may vary due to age or medical condition  Airway Management Planned: Oral ETT  Additional Equipment:   Intra-op Plan:   Post-operative Plan: Extubation in OR  Informed Consent: I have reviewed the patients History and Physical, chart, labs and discussed the procedure including the risks, benefits and alternatives for the proposed anesthesia with the patient or authorized representative who has indicated his/her understanding and acceptance.     Plan Discussed with: CRNA, Anesthesiologist and Surgeon  Anesthesia Plan Comments:         Anesthesia Quick Evaluation

## 2017-10-12 NOTE — Progress Notes (Signed)
The patient was interviewed and examined today.  The previously documented history and physical examination was reviewed. There are no changes. The operative procedure was reviewed. The risks and benefits were outlined again. The specific risks include, but are not limited to, anesthetic complications, bleeding, infections, and possible damage to the surrounding organs. The patient's questions were answered.  We are ready to proceed as outlined. The likelihood of the patient achieving the goals of this procedure is very likely.   BP 114/75   Pulse 73   Temp 97.8 F (36.6 C) (Oral)   Resp 16   SpO2 97%   Results for orders placed or performed during the hospital encounter of 10/12/17 (from the past 24 hour(s))  Pregnancy, urine     Status: None   Collection Time: 10/12/17  5:57 AM  Result Value Ref Range   Preg Test, Ur NEGATIVE NEGATIVE    CBC    Component Value Date/Time   WBC 6.9 10/02/2017 1130   RBC 5.16 (H) 10/02/2017 1130   HGB 11.6 (L) 10/02/2017 1130   HGB 11.8 04/13/2012 0815   HCT 36.9 10/02/2017 1130   HCT 36.1 04/13/2012 0815   PLT 325 10/02/2017 1130   PLT 263 04/13/2012 0815   MCV 71.5 (L) 10/02/2017 1130   MCV 71.3 (L) 04/13/2012 0815   MCH 22.5 (L) 10/02/2017 1130   MCHC 31.4 10/02/2017 1130   RDW 15.6 (H) 10/02/2017 1130   RDW 17.0 (H) 04/13/2012 0815   LYMPHSABS 1.5 04/13/2012 0815   MONOABS 0.3 04/13/2012 0815   EOSABS 0.1 04/13/2012 0815   BASOSABS 0.0 04/13/2012 0815    Gildardo Cranker, M.D.

## 2017-10-13 ENCOUNTER — Encounter (HOSPITAL_COMMUNITY): Payer: Self-pay | Admitting: Obstetrics and Gynecology

## 2017-10-13 DIAGNOSIS — N870 Mild cervical dysplasia: Secondary | ICD-10-CM | POA: Diagnosis not present

## 2017-10-13 DIAGNOSIS — N800 Endometriosis of uterus: Secondary | ICD-10-CM | POA: Diagnosis not present

## 2017-10-13 DIAGNOSIS — F419 Anxiety disorder, unspecified: Secondary | ICD-10-CM | POA: Diagnosis not present

## 2017-10-13 DIAGNOSIS — R338 Other retention of urine: Secondary | ICD-10-CM | POA: Diagnosis not present

## 2017-10-13 DIAGNOSIS — R87810 Cervical high risk human papillomavirus (HPV) DNA test positive: Secondary | ICD-10-CM | POA: Diagnosis not present

## 2017-10-13 DIAGNOSIS — N736 Female pelvic peritoneal adhesions (postinfective): Secondary | ICD-10-CM | POA: Diagnosis not present

## 2017-10-13 DIAGNOSIS — N72 Inflammatory disease of cervix uteri: Secondary | ICD-10-CM | POA: Diagnosis not present

## 2017-10-13 DIAGNOSIS — D251 Intramural leiomyoma of uterus: Secondary | ICD-10-CM | POA: Diagnosis not present

## 2017-10-13 DIAGNOSIS — D649 Anemia, unspecified: Secondary | ICD-10-CM | POA: Diagnosis not present

## 2017-10-13 MED ORDER — IBUPROFEN 800 MG PO TABS: ORAL_TABLET | ORAL | 1 refills | Status: DC

## 2017-10-13 MED ORDER — OXYCODONE-ACETAMINOPHEN 5-325 MG PO TABS: ORAL_TABLET | ORAL | 0 refills | Status: DC

## 2017-10-13 MED ORDER — PROMETHAZINE HCL 12.5 MG PO TABS: ORAL_TABLET | ORAL | 0 refills | Status: DC

## 2017-10-13 NOTE — Discharge Summary (Signed)
Physician Discharge Summary  Patient ID: Elizabeth Benson MRN: [id number] DOB/AGE: 03/25/1981 36 y.o.  Admit date: 10/12/2017 Discharge date: 10/14/2017   Discharge Diagnoses:   Endometriosis,  CIN-1,  adenomyosis,  urinary retention, and  Anemia   Operation:  10/12/2017 Laparoscopically Assisted Vaginal Hysterectomy with Bilateral Salpingectomy   Discharged Condition: Good  Hospital Course: On the date of admission the patient underwent the aforementioned procedures and tolerated them well.  Operative findings included stage 1 endometriosis with minimal adhesions between the bowel and the right pelvic sidewall. There were adhesions between the omentum and the anterior abdominal wall. Post operative course was marked by an episode of urinary retention which required that her foley cath be reinserted on 10/13/2017. On 10/14/2017, the patient was able to void adequately. Having resumed bowel and bladder function by post operative day #2, the patient was deemed ready for discharge home.  Discharge hemoglobin was 10.2.  Disposition: 01-Home or Self Care  Discharge Medications:  Allergies as of 10/13/2017   No Known Allergies     Medication List    STOP taking these medications   Hydrocodone-Acetaminophen 5-300 MG Tabs Commonly known as:  VICODIN   naproxen 500 MG tablet Commonly known as:  NAPROSYN     TAKE these medications   FLUoxetine 20 MG capsule Commonly known as:  PROZAC 1 po qd day 14-28 of cycle   ibuprofen 800 MG tablet Commonly known as:  ADVIL,MOTRIN 1 po pc every 8 hours for 3 days then prn pain What changed:    how much to take  how to take this  when to take this  reasons to take this  additional instructions   methocarbamol 750 MG tablet Commonly known as:  ROBAXIN-750 Take 1 tablet (750 mg total) by mouth 4 (four) times daily.   ONE-A-DAY WOMENS PO Take 1 tablet by mouth daily.   oxyCODONE-acetaminophen 5-325 MG tablet Commonly  known as:  PERCOCET/ROXICET 1-2 tablets every 6 hours as needed for post operative pain   promethazine 12.5 MG tablet Commonly known as:  PHENERGAN 1 po every 6 hours prn-nausea What changed:    how much to take  how to take this  when to take this  reasons to take this  additional instructions         Follow-up: Dr. Eli Hose on November 19, 2017 at 4:30 p.m.   Signed: Earnstine Regal, PA-C 10/13/2017, 8:13 AM   Edited by Dr. Raphael Gibney on 10/15/2017

## 2017-10-13 NOTE — Discharge Instructions (Signed)
Call [facility] @ [phone] if:  You have a temperature greater than or equal to 100.4 degrees Farenheit orally You have pain that is not made better by the pain medication given and taken as directed You have excessive bleeding or problems urinating  Take Colace (Docusate Sodium/Stool Softener) 100 mg 2-3 times daily while taking narcotic pain medicine to avoid constipation or until bowel movements are regular. Take Ibuprofen, with food as directed, for the next 5 days,  then as needed for pain  You may drive after 2 weeks You may walk up steps  You may shower  You may resume a regular diet Keep incision clean and dry; remove honeycomb dressing on 10/18/2017  Do not lift over 15 pounds for 6 weeks Avoid anything in vagina for 6 weeks (or until after your post-operative visit)

## 2017-10-13 NOTE — Progress Notes (Signed)
Elizabeth Benson is a38 y.o.  [id number]  Post Op Date # 1; LAVH/BS  Subjective: Patient is Postoperative course complicated by urinary retention. Patient has Pain is controlled with current analgesics. Medications being used: prescription NSAID's including Ketorolac 30 mg IV and narcotic analgesics including Percocet 5/325.Marland Kitchen Ambulating in halls without difficulty, passing flatus and tolerating clear liquids.  In & out catheterization yielded 800+ cc of urine.   Objective: Vital signs in last 24 hours: Temp:  [97.8 F (36.6 C)-98.5 F (36.9 C)] 98.2 F (36.8 C) (12/11 0356) Pulse Rate:  [52-94] 65 (12/11 0356) Resp:  [9-20] 17 (12/11 0356) BP: (96-124)/(49-85) 106/68 (12/11 0356) SpO2:  [93 %-100 %] 100 % (12/11 0356)  Intake/Output from previous day: 12/10 0701 - 12/11 0700 In: 1700 [I.V.:1700] Out: 1875 [Urine:1750] Intake/Output this shift: No intake/output data recorded. Recent Labs  Lab 10/12/17 2340  WBC 12.0*  HGB 10.2*  HCT 32.3*  PLT 271    No results for input(s): NA, K, CL, CO2, BUN, CREATININE, CALCIUM, PROT, BILITOT, ALKPHOS, ALT, AST, GLUCOSE in the last 168 hours.  Invalid input(s): LABALBU  EXAM: General: alert, cooperative and no distress Resp: clear to auscultation bilaterally Cardio: regular rate and rhythm, S1, S2 normal, no murmur, click, rub or gallop GI: bowel sounds present, mildly distended and soft Extremities: Homans sign is negative, no sign of DVT and SCD hose in place and functiong-no calf tenderness. Vaginal Bleeding: none and  patient reports scant dry stain prior to changing pad. Umbilical dressing was clean/dry/intact  Assessment: s/p Procedure(s): LAPAROSCOPy, VAGINAL HYSTERECTOMY WITH BILATERAL  SALPINGECTOMY: stable, progressing well, urinary retention and anemia  Plan: Advance diet Encourage ambulation Routine care.  LOS: 0 days    Earnstine Regal, PA-C 10/13/2017 7:29 AM

## 2017-10-13 NOTE — Progress Notes (Signed)
In and Out Cath performed after several attempts to void and bladder scanner showed >589 urine. 800cc of clear yellow urine obtained.

## 2017-10-14 ENCOUNTER — Encounter (HOSPITAL_COMMUNITY): Payer: Self-pay | Admitting: Obstetrics and Gynecology

## 2017-10-14 DIAGNOSIS — R338 Other retention of urine: Secondary | ICD-10-CM | POA: Diagnosis not present

## 2017-10-14 DIAGNOSIS — D251 Intramural leiomyoma of uterus: Secondary | ICD-10-CM | POA: Diagnosis not present

## 2017-10-14 DIAGNOSIS — N736 Female pelvic peritoneal adhesions (postinfective): Secondary | ICD-10-CM | POA: Diagnosis not present

## 2017-10-14 DIAGNOSIS — N800 Endometriosis of uterus: Secondary | ICD-10-CM | POA: Diagnosis not present

## 2017-10-14 DIAGNOSIS — R87810 Cervical high risk human papillomavirus (HPV) DNA test positive: Secondary | ICD-10-CM | POA: Diagnosis not present

## 2017-10-14 DIAGNOSIS — F419 Anxiety disorder, unspecified: Secondary | ICD-10-CM | POA: Diagnosis not present

## 2017-10-14 DIAGNOSIS — N870 Mild cervical dysplasia: Secondary | ICD-10-CM | POA: Diagnosis not present

## 2017-10-14 DIAGNOSIS — D649 Anemia, unspecified: Secondary | ICD-10-CM | POA: Diagnosis not present

## 2017-10-14 DIAGNOSIS — N72 Inflammatory disease of cervix uteri: Secondary | ICD-10-CM | POA: Diagnosis not present

## 2017-10-14 MED FILL — PROMETHAZINE 12.5 MG TABLET: 12.5 | 7 days supply | Qty: 30 | Fill #0

## 2017-10-14 MED FILL — OXYCOD/ACETAMINOPHEN 5-325M: 5-325 | 2 days supply | Qty: 20 | Fill #0

## 2017-10-14 MED FILL — IBUPROFEN 800 MG TAB: 800 | 10 days supply | Qty: 30 | Fill #0

## 2017-10-14 NOTE — Progress Notes (Signed)
Subjective: Patient reports tolerating PO.  Voids yesterday < 100 and high residuals. Cath inserted overnight. Cath removed at 7:00. Good pain control today.  Objective: I have reviewed patient's vital signs and intake and output.  General: no distress GI: soft, non-tender; bowel sounds normal; no masses,  no organomegaly Extremities: extremities normal, atraumatic, no cyanosis or edema Vaginal Bleeding: minimal   Assessment/Plan: Urinary retention. Will try to void again today. Then, D/C to home.  LOS: 0 days    Eli Hose 10/14/2017, 7:33 AM

## 2017-10-14 NOTE — Progress Notes (Signed)
Pt teaching complete  Out in wheelchair   

## 2018-03-10 ENCOUNTER — Encounter: Payer: Self-pay | Admitting: Family Medicine

## 2018-03-10 ENCOUNTER — Ambulatory Visit (INDEPENDENT_AMBULATORY_CARE_PROVIDER_SITE_OTHER): Payer: 59 | Admitting: Family Medicine

## 2018-03-10 ENCOUNTER — Other Ambulatory Visit: Payer: Self-pay | Admitting: Family Medicine

## 2018-03-10 VITALS — BP 100/78 | HR 62 | Temp 98.1°F | Ht 60.0 in | Wt 177.0 lb

## 2018-03-10 DIAGNOSIS — F419 Anxiety disorder, unspecified: Secondary | ICD-10-CM

## 2018-03-10 DIAGNOSIS — F329 Major depressive disorder, single episode, unspecified: Secondary | ICD-10-CM

## 2018-03-10 DIAGNOSIS — Z13220 Encounter for screening for lipoid disorders: Secondary | ICD-10-CM

## 2018-03-10 DIAGNOSIS — E559 Vitamin D deficiency, unspecified: Secondary | ICD-10-CM

## 2018-03-10 DIAGNOSIS — Z862 Personal history of diseases of the blood and blood-forming organs and certain disorders involving the immune mechanism: Secondary | ICD-10-CM | POA: Diagnosis not present

## 2018-03-10 DIAGNOSIS — Z131 Encounter for screening for diabetes mellitus: Secondary | ICD-10-CM | POA: Diagnosis not present

## 2018-03-10 DIAGNOSIS — R635 Abnormal weight gain: Secondary | ICD-10-CM | POA: Diagnosis not present

## 2018-03-10 DIAGNOSIS — F32A Depression, unspecified: Secondary | ICD-10-CM

## 2018-03-10 DIAGNOSIS — Z0000 Encounter for general adult medical examination without abnormal findings: Secondary | ICD-10-CM | POA: Diagnosis not present

## 2018-03-10 DIAGNOSIS — J302 Other seasonal allergic rhinitis: Secondary | ICD-10-CM | POA: Diagnosis not present

## 2018-03-10 LAB — CBC WITH DIFFERENTIAL/PLATELET
BASOS ABS: 0 10*3/uL (ref 0.0–0.1)
Basophils Relative: 0.4 % (ref 0.0–3.0)
EOS PCT: 3 % (ref 0.0–5.0)
Eosinophils Absolute: 0.2 10*3/uL (ref 0.0–0.7)
HEMATOCRIT: 37.1 % (ref 36.0–46.0)
HEMOGLOBIN: 11.8 g/dL — AB (ref 12.0–15.0)
LYMPHS PCT: 43.5 % (ref 12.0–46.0)
Lymphs Abs: 2.2 10*3/uL (ref 0.7–4.0)
MCHC: 31.7 g/dL (ref 30.0–36.0)
MCV: 70.9 fl — AB (ref 78.0–100.0)
MONOS PCT: 5.4 % (ref 3.0–12.0)
Monocytes Absolute: 0.3 10*3/uL (ref 0.1–1.0)
Neutro Abs: 2.4 10*3/uL (ref 1.4–7.7)
Neutrophils Relative %: 47.7 % (ref 43.0–77.0)
Platelets: 327 10*3/uL (ref 150.0–400.0)
RBC: 5.23 Mil/uL — AB (ref 3.87–5.11)
RDW: 17.3 % — ABNORMAL HIGH (ref 11.5–15.5)
WBC: 5.1 10*3/uL (ref 4.0–10.5)

## 2018-03-10 LAB — BASIC METABOLIC PANEL
BUN: 9 mg/dL (ref 6–23)
CALCIUM: 9.3 mg/dL (ref 8.4–10.5)
CO2: 28 meq/L (ref 19–32)
Chloride: 101 mEq/L (ref 96–112)
Creatinine, Ser: 0.78 mg/dL (ref 0.40–1.20)
GFR: 106.19 mL/min (ref >60.00)
Glucose, Bld: 86 mg/dL (ref 70–99)
Potassium: 4.8 mEq/L (ref 3.5–5.1)
SODIUM: 138 meq/L (ref 135–145)

## 2018-03-10 LAB — HEMOGLOBIN A1C: HEMOGLOBIN A1C: 5.8 % (ref 4.6–6.5)

## 2018-03-10 LAB — VITAMIN D 25 HYDROXY (VIT D DEFICIENCY, FRACTURES): VITD: 11.5 ng/mL — ABNORMAL LOW (ref 30.00–100.00)

## 2018-03-10 LAB — TSH: TSH: 0.95 u[IU]/mL (ref 0.35–4.50)

## 2018-03-10 LAB — LIPID PANEL
CHOL/HDL RATIO: 4
Cholesterol: 188 mg/dL (ref 0–200)
HDL: 46.6 mg/dL (ref >39.00)
LDL Cholesterol: 124 mg/dL — ABNORMAL HIGH (ref 0–99)
NONHDL: 141.19
Triglycerides: 85 mg/dL (ref 0.0–149.0)
VLDL: 17 mg/dL (ref 0.0–40.0)

## 2018-03-10 LAB — T4, FREE: FREE T4: 0.98 ng/dL (ref 0.60–1.60)

## 2018-03-10 MED ORDER — SERTRALINE HCL 50 MG PO TABS: 50.0000 mg | ORAL_TABLET | Freq: Every day | ORAL | 3 refills | Status: DC

## 2018-03-10 MED ORDER — VITAMIN D (ERGOCALCIFEROL) 1.25 MG (50000 UNIT) PO CAPS: 50000.0000 [IU] | ORAL_CAPSULE | ORAL | 0 refills | Status: DC

## 2018-03-10 MED FILL — SERTRALINE HCL 50 MG TABLET: 50 | 30 days supply | Qty: 30 | Fill #0

## 2018-03-10 NOTE — Progress Notes (Signed)
Patient presents to clinic today for CPE and to establish care.  SUBJECTIVE: PMH: Pt is a 37 yo female with pmh sig for depression, HLD, h/o anemia, seasonal allergies.  Pt was previously seen by Elizabeth Benson.  Depression: -Pt endorses a history of depression times years -Currently pt endorses poor energy, sleep (may wake up 2 x/night), mood, concentration. -In the past pt was on Prozac.  Pt states she did not like Prozac as it gave her muscle soreness in her neck. -Patient is open to counseling.  Seasonal allergies: -Pt currently taking Zyrtec as needed -pt states seem to be helping much. -In the past pt has tried over-the-counter medications with little relief -has not tried a nasal spray. -In the past pt did allergy testing and allergy shots were recommended however she is not open to this idea.  Weight gain: -Patient has noticed a 15 pound weight gain over the last few years. -Patient is not currently exercising as she does not have any energy. -Patient tries to be mindful of what she eats.  Allergies: NKDA  Past surgical history: Hysterectomy 2/2 endometriosis 2018  Social history: Patient is single.  Patient has 2 children, a son and a daughter ages 14 and 58.  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  Patient is also in school, working on a masters in Network engineer.  Pt denies tobacco, drug use.  Pt endorses occasional alcohol use.  History: Mom-alive, arthritis, diabetes, HTN Dad-alive, HLD, HTN Sister-Ashley, alive MGM-deceased, diabetes, heart disease, HTN MGF-deceased, HTN, stroke PGM-deceased, diabetes PGF-deceased, HTN, stroke  Health Maintenance: Dental --[place] vision Immunizations --influenza vaccine 2018 PAP --07/2017   Past Medical History:  Diagnosis Date  . Anxiety    no meds  . Depression    no meds  . Endometriosis   . History of cervical dysplasia    CIN I  . HSV infection   .  Pelvic pain in female   . PONV (postoperative nausea and vomiting)   . SVD (spontaneous vaginal delivery)    x 2  . Thalassemia minor   . Thalassemia trait   . Wears glasses     Past Surgical History:  Procedure Laterality Date  . LAPAROSCOPIC VAGINAL HYSTERECTOMY WITH SALPINGECTOMY Bilateral 10/12/2017   Procedure: LAPAROSCOPy, VAGINAL HYSTERECTOMY WITH BILATERAL  SALPINGECTOMY;  Surgeon: Ena Dawley, MD;  Location: [facility];  Service: Gynecology;  Laterality: Bilateral;  3 Hours  . LAPAROSCOPY  2002   for endometriosis  . LAPAROSCOPY  08/06/2012   Procedure: LAPAROSCOPY OPERATIVE;  Surgeon: Ena Dawley, MD;  Location: [facility];  Service: Gynecology;  Laterality: N/A;  Laparoscopic Pelvic Biopsies  . LAPAROSCOPY N/A 09/04/2015   Procedure: LAPAROSCOPY OPERATIVE with peritoneal biopsies;  Surgeon: Ena Dawley, MD;  Location: [facility];  Service: Gynecology;  Laterality: N/A;  . WISDOM TOOTH EXTRACTION  1990's    Current Outpatient Medications on File Prior to Visit  Medication Sig Dispense Refill  . Multiple Vitamins-Calcium (ONE-A-DAY WOMENS PO) Take 1 tablet by mouth daily.     No current facility-administered medications on file prior to visit.     No Known Allergies  Family History  Problem Relation Age of Onset  . Hypertension Mother   . Hypertension Maternal Aunt   . Diabetes Maternal Aunt   . Hypertension Maternal Uncle   . Diabetes Maternal Uncle   . Diabetes Paternal Aunt   . Hypertension Paternal Aunt   . Cancer Paternal  Aunt        breast  . Diabetes Paternal Uncle   . Hypertension Paternal Uncle   . Stroke Paternal Uncle   . Hypertension Maternal Grandmother   . Cancer Maternal Grandfather        prostate  . Hypertension Maternal Grandfather   . Stroke Maternal Grandfather   . Diabetes Paternal Grandmother   . Stroke Paternal Grandfather   . Hypertension Paternal Grandfather     Social History   Socioeconomic History  .  Marital status: Single    Spouse name: Not on file  . Number of children: Not on file  . Years of education: Not on file  . Highest education level: Not on file  Occupational History  . Not on file  Social Needs  . Financial resource strain: Not on file  . Food insecurity:    Worry: Not on file    Inability: Not on file  . Transportation needs:    Medical: Not on file    Non-medical: Not on file  Tobacco Use  . Smoking status: Former Smoker    Packs/day: 0.25    Years: 8.00    Pack years: 2.00    Types: Cigarettes    Last attempt to quit: 08/09/2011    Years since quitting: 6.5  . Smokeless tobacco: Never Used  Substance and Sexual Activity  . Alcohol use: Yes    Alcohol/week: 0.0 oz    Comment: occasional   . Drug use: No  . Sexual activity: Not Currently    Birth control/protection: None  Lifestyle  . Physical activity:    Days per week: Not on file    Minutes per session: Not on file  . Stress: Not on file  Relationships  . Social connections:    Talks on phone: Not on file    Gets together: Not on file    Attends religious service: Not on file    Active member of club or organization: Not on file    Attends meetings of clubs or organizations: Not on file    Relationship status: Not on file  . Intimate partner violence:    Fear of current or ex partner: Not on file    Emotionally abused: Not on file    Physically abused: Not on file    Forced sexual activity: Not on file  Other Topics Concern  . Not on file  Social History Narrative  . Not on file    ROS General: Denies fever, chills, night sweats, changes in appetite  + weight gain HEENT: Denies headaches, ear pain, changes in vision, rhinorrhea, sore throat CV: Denies CP, palpitations, SOB, orthopnea Pulm: Denies SOB, cough, wheezing GI: Denies abdominal pain, nausea, vomiting, diarrhea, constipation GU: Denies dysuria, hematuria, frequency, vaginal discharge Msk: Denies muscle cramps, joint  pains Neuro: Denies weakness, numbness, tingling Skin: Denies rashes, bruising Psych: Denies hallucinations  +depression, anxiety  BP 100/78 (BP Location: Right Arm, Patient Position: Sitting, Cuff Size: Large)   Pulse 62   Temp 98.1 F (36.7 C) (Oral)   Ht 5' (1.524 m)   Wt 177 lb (80.3 kg)   SpO2 99%   BMI 34.57 kg/m   Physical Exam Gen. Pleasant, well developed, well-nourished, in NAD HEENT - [place]/AT, PERRL, no scleral icterus, no nasal drainage, pharynx without erythema or exudate.  TMs normal bilaterally.  No cervical lymphadenopathy. Lungs: no use of accessory muscles, CTAB, no wheezes, rales or rhonchi Cardiovascular: RRR,  No r/g/m, no peripheral edema Abdomen:  BS present, soft, nontender, nondistended, no hepatosplenomegaly Musculoskeletal: No deformities, moves all four extremities, no cyanosis or clubbing, normal tone Neuro:  A&Ox3, CN II-XII intact, normal gait Skin:  Warm, dry, intact, no lesions Psych: normal affect, mood   No results found for this or any previous visit (from the past 2160 hour(s)).  Assessment/Plan: Well adult exam -Anticipatory guidance given including wearing seatbelts, smoke detectors in the home, increasing physical activity, increasing intake of water and vegetables. -We will obtain labs this visit -Pap up-to-date patient followed by OB/GYN - Plan: CBC with Differential/Platelet, Basic metabolic panel, CBC with Differential/Platelet  Anxiety and depression -PHQ 9 score 20 -Gad 7 score 19 -Will not resume Prozac given history of myalgia -Pt open to counseling.  Given information on area resources. - Plan: sertraline (ZOLOFT) 50 MG tablet, TSH, T4, Free, Vitamin D, 25-hydroxy  Seasonal allergies -Continue Zyrtec -Offered patient different allergy medication but she declines at this time.  Will continue to offer at each visit.  Screening for diabetes mellitus  - Plan: Hemoglobin A1c  Screening for cholesterol level  - Plan: Lipid  panel  Weight gain  - Plan: TSH, T4, Free  History of anemia  - Plan: CBC with Differential/Platelet, CBC with Differential/Platelet  Follow-up in 6 weeks for anxiety and depression, sooner if needed  Grier Mitts, MD

## 2018-03-10 NOTE — Patient Instructions (Signed)
Preventive Care 18-39 Years, Female Preventive care refers to lifestyle choices and visits with your health care provider that can promote health and wellness. What does preventive care include?  A yearly physical exam. This is also called an annual well check.  Dental exams once or twice a year.  Routine eye exams. Ask your health care provider how often you should have your eyes checked.  Personal lifestyle choices, including: ? Daily care of your teeth and gums. ? Regular physical activity. ? Eating a healthy diet. ? Avoiding tobacco and drug use. ? Limiting alcohol use. ? Practicing safe sex. ? Taking vitamin and mineral supplements as recommended by your health care provider. What happens during an annual well check? The services and screenings done by your health care provider during your annual well check will depend on your age, overall health, lifestyle risk factors, and family history of disease. Counseling Your health care provider may ask you questions about your:  Alcohol use.  Tobacco use.  Drug use.  Emotional well-being.  Home and relationship well-being.  Sexual activity.  Eating habits.  Work and work Statistician.  Method of birth control.  Menstrual cycle.  Pregnancy history.  Screening You may have the following tests or measurements:  Height, weight, and BMI.  Diabetes screening. This is done by checking your blood sugar (glucose) after you have not eaten for a while (fasting).  Blood pressure.  Lipid and cholesterol levels. These may be checked every 5 years starting at age 66.  Skin check.  Hepatitis C blood test.  Hepatitis B blood test.  Sexually transmitted disease (STD) testing.  BRCA-related cancer screening. This may be done if you have a family history of breast, ovarian, tubal, or peritoneal cancers.  Pelvic exam and Pap test. This may be done every 3 years starting at age 40. Starting at age 59, this may be done every 5  years if you have a Pap test in combination with an HPV test.  Discuss your test results, treatment options, and if necessary, the need for more tests with your health care provider. Vaccines Your health care provider may recommend certain vaccines, such as:  Influenza vaccine. This is recommended every year.  Tetanus, diphtheria, and acellular pertussis (Tdap, Td) vaccine. You may need a Td booster every 10 years.  Varicella vaccine. You may need this if you have not been vaccinated.  HPV vaccine. If you are 69 or younger, you may need three doses over 6 months.  Measles, mumps, and rubella (MMR) vaccine. You may need at least one dose of MMR. You may also need a second dose.  Pneumococcal 13-valent conjugate (PCV13) vaccine. You may need this if you have certain conditions and were not previously vaccinated.  Pneumococcal polysaccharide (PPSV23) vaccine. You may need one or two doses if you smoke cigarettes or if you have certain conditions.  Meningococcal vaccine. One dose is recommended if you are age 27-21 years and a first-year college student living in a residence hall, or if you have one of several medical conditions. You may also need additional booster doses.  Hepatitis A vaccine. You may need this if you have certain conditions or if you travel or work in places where you may be exposed to hepatitis A.  Hepatitis B vaccine. You may need this if you have certain conditions or if you travel or work in places where you may be exposed to hepatitis B.  Haemophilus influenzae type b (Hib) vaccine. You may need this if  you have certain risk factors.  Talk to your health care provider about which screenings and vaccines you need and how often you need them. This information is not intended to replace advice given to you by your health care provider. Make sure you discuss any questions you have with your health care provider. Document Released: 12/16/2001 Document Revised: 07/09/2016  Document Reviewed: 08/21/2015 Elsevier Interactive Patient Education  2018 [place] With Depression Everyone experiences occasional disappointment, sadness, and loss in their lives. When you are feeling down, blue, or sad for at least 2 weeks in a row, it may mean that you have depression. Depression can affect your thoughts and feelings, relationships, daily activities, and physical health. It is caused by changes in the way your brain functions. If you receive a diagnosis of depression, your health care provider will tell you which type of depression you have and what treatment options are available to you. If you are living with depression, there are ways to help you recover from it and also ways to prevent it from coming back. How to cope with lifestyle changes Coping with stress Stress is your body's reaction to life changes and events, both good and bad. Stressful situations may include:  Getting married.  The death of a spouse.  Losing a job.  Retiring.  Having a baby.  Stress can last just a few hours or it can be ongoing. Stress can play a major role in depression, so it is important to learn both how to cope with stress and how to think about it differently. Talk with your health care provider or a counselor if you would like to learn more about stress reduction. He or she may suggest some stress reduction techniques, such as:  Music therapy. This can include creating music or listening to music. Choose music that you enjoy and that inspires you.  Mindfulness-based meditation. This kind of meditation can be done while sitting or walking. It involves being aware of your normal breaths, rather than trying to control your breathing.  Centering prayer. This is a kind of meditation that involves focusing on a spiritual word or phrase. Choose a word, phrase, or sacred image that is meaningful to you and that brings you peace.  Deep breathing. To do this, expand your  stomach and inhale slowly through your nose. Hold your breath for 3-5 seconds, then exhale slowly, allowing your stomach muscles to relax.  Muscle relaxation. This involves intentionally tensing muscles then relaxing them.  Choose a stress reduction technique that fits your lifestyle and personality. Stress reduction techniques take time and practice to develop. Set aside 5-15 minutes a day to do them. Therapists can offer training in these techniques. The training may be covered by some insurance plans. Other things you can do to manage stress include:  Keeping a stress diary. This can help you learn what triggers your stress and ways to control your response.  Understanding what your limits are and saying no to requests or events that lead to a schedule that is too full.  Thinking about how you respond to certain situations. You may not be able to control everything, but you can control how you react.  Adding humor to your life by watching funny films or TV shows.  Making time for activities that help you relax and not feeling guilty about spending your time this way.  Medicines Your health care provider may suggest certain medicines if he or she feels that they will help  improve your condition. Avoid using alcohol and other substances that may prevent your medicines from working properly (may interact). It is also important to:  Talk with your pharmacist or health care provider about all the medicines that you take, their possible side effects, and what medicines are safe to take together.  Make it your goal to take part in all treatment decisions (shared decision-making). This includes giving input on the side effects of medicines. It is best if shared decision-making with your health care provider is part of your total treatment plan.  If your health care provider prescribes a medicine, you may not notice the full benefits of it for 4-8 weeks. Most people who are treated for depression  need to be on medicine for at least 6-12 months after they feel better. If you are taking medicines as part of your treatment, do not stop taking medicines without first talking to your health care provider. You may need to have the medicine slowly decreased (tapered) over time to decrease the risk of harmful side effects. Relationships Your health care provider may suggest family therapy along with individual therapy and drug therapy. While there may not be family problems that are causing you to feel depressed, it is still important to make sure your family learns as much as they can about your mental health. Having your family's support can help make your treatment successful. How to recognize changes in your condition Everyone has a different response to treatment for depression. Recovery from major depression happens when you have not had signs of major depression for two months. This may mean that you will start to:  Have more interest in doing activities.  Feel less hopeless than you did 2 months ago.  Have more energy.  Overeat less often, or have better or improving appetite.  Have better concentration.  Your health care provider will work with you to decide the next steps in your recovery. It is also important to recognize when your condition is getting worse. Watch for these signs:  Having fatigue or low energy.  Eating too much or too little.  Sleeping too much or too little.  Feeling restless, agitated, or hopeless.  Having trouble concentrating or making decisions.  Having unexplained physical complaints.  Feeling irritable, angry, or aggressive.  Get help as soon as you or your family members notice these symptoms coming back. How to get support and help from others How to talk with friends and family members about your condition Talking to friends and family members about your condition can provide you with one way to get support and guidance. Reach out to trusted  friends or family members, explain your symptoms to them, and let them know that you are working with a health care provider to treat your depression. Financial resources Not all insurance plans cover mental health care, so it is important to check with your insurance carrier. If paying for co-pays or counseling services is a problem, search for a local or county mental health care center. They may be able to offer public mental health care services at low or no cost when you are not able to see a private health care provider. If you are taking medicine for depression, you may be able to get the generic form, which may be less expensive. Some makers of prescription medicines also offer help to patients who cannot afford the medicines they need. Follow these instructions at home:  Get the right amount and quality of sleep.  Cut  down on using caffeine, tobacco, alcohol, and other potentially harmful substances.  Try to exercise, such as walking or lifting small weights.  Take over-the-counter and prescription medicines only as told by your health care provider.  Eat a healthy diet that includes plenty of vegetables, fruits, whole grains, low-fat dairy products, and lean protein. Do not eat a lot of foods that are high in solid fats, added sugars, or salt.  Keep all follow-up visits as told by your health care provider. This is important. Contact a health care provider if:  You stop taking your antidepressant medicines, and you have any of these symptoms: ? Nausea. ? Headache. ? Feeling lightheaded. ? Chills and body aches. ? Not being able to sleep (insomnia).  You or your friends and family think your depression is getting worse. Get help right away if:  You have thoughts of hurting yourself or others. If you ever feel like you may hurt yourself or others, or have thoughts about taking your own life, get help right away. You can go to your nearest emergency department or call:  Your  local emergency services (911 in the [place]  A suicide crisis helpline, such as the [place] at 404-611-9322. This is open 24-hours a day.  Summary  If you are living with depression, there are ways to help you recover from it and also ways to prevent it from coming back.  Work with your health care team to create a management plan that includes counseling, stress management techniques, and healthy lifestyle habits. This information is not intended to replace advice given to you by your health care provider. Make sure you discuss any questions you have with your health care provider. Document Released: 09/22/2016 Document Revised: 09/22/2016 Document Reviewed: 09/22/2016 Elsevier Interactive Patient Education  2018 [place] After being diagnosed with an anxiety disorder, you may be relieved to know why you have felt or behaved a certain way. It is natural to also feel overwhelmed about the treatment ahead and what it will mean for your life. With care and support, you can manage this condition and recover from it. How to cope with anxiety Dealing with stress Stress is your body's reaction to life changes and events, both good and bad. Stress can last just a few hours or it can be ongoing. Stress can play a major role in anxiety, so it is important to learn both how to cope with stress and how to think about it differently. Talk with your health care provider or a counselor to learn more about stress reduction. He or she may suggest some stress reduction techniques, such as:  Music therapy. This can include creating or listening to music that you enjoy and that inspires you.  Mindfulness-based meditation. This involves being aware of your normal breaths, rather than trying to control your breathing. It can be done while sitting or walking.  Centering prayer. This is a kind of meditation that involves focusing on a word, phrase, or sacred  image that is meaningful to you and that brings you peace.  Deep breathing. To do this, expand your stomach and inhale slowly through your nose. Hold your breath for 3-5 seconds. Then exhale slowly, allowing your stomach muscles to relax.  Self-talk. This is a skill where you identify thought patterns that lead to anxiety reactions and correct those thoughts.  Muscle relaxation. This involves tensing muscles then relaxing them.  Choose a stress reduction technique that fits your lifestyle  and personality. Stress reduction techniques take time and practice. Set aside 5-15 minutes a day to do them. Therapists can offer training in these techniques. The training may be covered by some insurance plans. Other things you can do to manage stress include:  Keeping a stress diary. This can help you learn what triggers your stress and ways to control your response.  Thinking about how you respond to certain situations. You may not be able to control everything, but you can control your reaction.  Making time for activities that help you relax, and not feeling guilty about spending your time in this way.  Therapy combined with coping and stress-reduction skills provides the best chance for successful treatment. Medicines Medicines can help ease symptoms. Medicines for anxiety include:  Anti-anxiety drugs.  Antidepressants.  Beta-blockers.  Medicines may be used as the main treatment for anxiety disorder, along with therapy, or if other treatments are not working. Medicines should be prescribed by a health care provider. Relationships Relationships can play a big part in helping you recover. Try to spend more time connecting with trusted friends and family members. Consider going to couples counseling, taking family education classes, or going to family therapy. Therapy can help you and others better understand the condition. How to recognize changes in your condition Everyone has a different  response to treatment for anxiety. Recovery from anxiety happens when symptoms decrease and stop interfering with your daily activities at home or work. This may mean that you will start to:  Have better concentration and focus.  Sleep better.  Be less irritable.  Have more energy.  Have improved memory.  It is important to recognize when your condition is getting worse. Contact your health care provider if your symptoms interfere with home or work and you do not feel like your condition is improving. Where to find help and support: You can get help and support from these sources:  Self-help groups.  Online and [employer].  A trusted spiritual leader.  Couples counseling.  Family education classes.  Family therapy.  Follow these instructions at home:  Eat a healthy diet that includes plenty of vegetables, fruits, whole grains, low-fat dairy products, and lean protein. Do not eat a lot of foods that are high in solid fats, added sugars, or salt.  Exercise. Most adults should do the following: ? Exercise for at least 150 minutes each week. The exercise should increase your heart rate and make you sweat (moderate-intensity exercise). ? Strengthening exercises at least twice a week.  Cut down on caffeine, tobacco, alcohol, and other potentially harmful substances.  Get the right amount and quality of sleep. Most adults need 7-9 hours of sleep each night.  Make choices that simplify your life.  Take over-the-counter and prescription medicines only as told by your health care provider.  Avoid caffeine, alcohol, and certain over-the-counter cold medicines. These may make you feel worse. Ask your pharmacist which medicines to avoid.  Keep all follow-up visits as told by your health care provider. This is important. Questions to ask your health care provider  Would I benefit from therapy?  How often should I follow up with a health care provider?  How long do  I need to take medicine?  Are there any long-term side effects of my medicine?  Are there any alternatives to taking medicine? Contact a health care provider if:  You have a hard time staying focused or finishing daily tasks.  You spend many hours a day feeling  worried about everyday life.  You become exhausted by worry.  You start to have headaches, feel tense, or have nausea.  You urinate more than normal.  You have diarrhea. Get help right away if:  You have a racing heart and shortness of breath.  You have thoughts of hurting yourself or others. If you ever feel like you may hurt yourself or others, or have thoughts about taking your own life, get help right away. You can go to your nearest emergency department or call:  Your local emergency services (911 in the [place]  A suicide crisis helpline, such as the [place] at 813-403-5927. This is open 24-hours a day.  Summary  Taking steps to deal with stress can help calm you.  Medicines cannot cure anxiety disorders, but they can help ease symptoms.  Family, friends, and partners can play a big part in helping you recover from an anxiety disorder. This information is not intended to replace advice given to you by your health care provider. Make sure you discuss any questions you have with your health care provider. Document Released: 10/14/2016 Document Revised: 10/14/2016 Document Reviewed: 10/14/2016 Elsevier Interactive Patient Education  [employer].

## 2018-03-11 ENCOUNTER — Other Ambulatory Visit: Payer: Self-pay | Admitting: *Deleted

## 2018-03-11 DIAGNOSIS — E559 Vitamin D deficiency, unspecified: Secondary | ICD-10-CM

## 2018-03-14 ENCOUNTER — Encounter: Payer: Self-pay | Admitting: Family Medicine

## 2018-04-21 ENCOUNTER — Ambulatory Visit: Payer: 59 | Admitting: Family Medicine

## 2018-04-29 MED FILL — SERTRALINE HCL 50 MG TABLET: 50 | 30 days supply | Qty: 30 | Fill #1

## 2018-05-10 ENCOUNTER — Encounter: Payer: Self-pay | Admitting: Family Medicine

## 2018-05-10 ENCOUNTER — Ambulatory Visit: Payer: 59 | Admitting: Family Medicine

## 2018-05-10 VITALS — BP 110/80 | HR 88 | Temp 98.4°F | Wt 174.0 lb

## 2018-05-10 DIAGNOSIS — F32A Depression, unspecified: Secondary | ICD-10-CM

## 2018-05-10 DIAGNOSIS — M546 Pain in thoracic spine: Secondary | ICD-10-CM

## 2018-05-10 DIAGNOSIS — L819 Disorder of pigmentation, unspecified: Secondary | ICD-10-CM | POA: Diagnosis not present

## 2018-05-10 DIAGNOSIS — F329 Major depressive disorder, single episode, unspecified: Secondary | ICD-10-CM | POA: Diagnosis not present

## 2018-05-10 DIAGNOSIS — E559 Vitamin D deficiency, unspecified: Secondary | ICD-10-CM

## 2018-05-10 DIAGNOSIS — L258 Unspecified contact dermatitis due to other agents: Secondary | ICD-10-CM | POA: Diagnosis not present

## 2018-05-10 DIAGNOSIS — F419 Anxiety disorder, unspecified: Secondary | ICD-10-CM

## 2018-05-10 MED ORDER — SERTRALINE HCL 50 MG PO TABS: 50.0000 mg | ORAL_TABLET | Freq: Every day | ORAL | 1 refills | Status: DC

## 2018-05-10 MED ORDER — HYDROCORTISONE 2.5 % EX CREA: TOPICAL_CREAM | Freq: Two times a day (BID) | CUTANEOUS | 0 refills | Status: AC

## 2018-05-10 NOTE — Patient Instructions (Signed)
Back Pain, Adult Many adults have back pain from time to time. Common causes of back pain include:  A strained muscle or ligament.  Wear and tear (degeneration) of the spinal disks.  Arthritis.  A hit to the back.  Back pain can be short-lived (acute) or last a long time (chronic). A physical exam, lab tests, and imaging studies may be done to find the cause of your pain. Follow these instructions at home: Managing pain and stiffness  Take over-the-counter and prescription medicines only as told by your health care provider.  If directed, apply heat to the affected area as often as told by your health care provider. Use the heat source that your health care provider recommends, such as a moist heat pack or a heating pad. ? Place a towel between your skin and the heat source. ? Leave the heat on for 20-30 minutes. ? Remove the heat if your skin turns bright red. This is especially important if you are unable to feel pain, heat, or cold. You have a greater risk of getting burned.  If directed, apply ice to the injured area: ? Put ice in a plastic bag. ? Place a towel between your skin and the bag. ? Leave the ice on for 20 minutes, 2-3 times a day for the first 2-3 days. Activity  Do not stay in bed. Resting more than 1-2 days can delay your recovery.  Take short walks on even surfaces as soon as you are able. Try to increase the length of time you walk each day.  Do not sit, drive, or stand in one place for more than 30 minutes at a time. Sitting or standing for long periods of time can put stress on your back.  Use proper lifting techniques. When you bend and lift, use positions that put less stress on your back: ? Bend your knees. ? Keep the load close to your body. ? Avoid twisting.  Exercise regularly as told by your health care provider. Exercising will help your back heal faster. This also helps prevent back injuries by keeping muscles strong and flexible.  Your health  care provider may recommend that you see a physical therapist. This person can help you come up with a safe exercise program. Do any exercises as told by your physical therapist. Lifestyle  Maintain a healthy weight. Extra weight puts stress on your back and makes it difficult to have good posture.  Avoid activities or situations that make you feel anxious or stressed. Learn ways to manage anxiety and stress. One way to manage stress is through exercise. Stress and anxiety increase muscle tension and can make back pain worse. General instructions  Sleep on a firm mattress in a comfortable position. Try lying on your side with your knees slightly bent. If you lie on your back, put a pillow under your knees.  Follow your treatment plan as told by your health care provider. This may include: ? Cognitive or behavioral therapy. ? Acupuncture or massage therapy. ? Meditation or yoga. Contact a health care provider if:  You have pain that is not relieved with rest or medicine.  You have increasing pain going down into your legs or buttocks.  Your pain does not improve in 2 weeks.  You have pain at night.  You lose weight.  You have a fever or chills. Get help right away if:  You develop new bowel or bladder control problems.  You have unusual weakness or numbness in your arms   or legs.  You develop nausea or vomiting.  You develop abdominal pain.  You feel faint. Summary  Many adults have back pain from time to time. A physical exam, lab tests, and imaging studies may be done to find the cause of your pain.  Use proper lifting techniques. When you bend and lift, use positions that put less stress on your back.  Take over-the-counter and prescription medicines and apply heat or ice as directed by your health care provider. This information is not intended to replace advice given to you by your health care provider. Make sure you discuss any questions you have with your health care  provider. Document Released: 10/20/2005 Document Revised: 11/24/2016 Document Reviewed: 11/24/2016 Elsevier Interactive Patient Education  2018 [employer].  Back Exercises If you have pain in your back, do these exercises 2-3 times each day or as told by your doctor. When the pain goes away, do the exercises once each day, but repeat the steps more times for each exercise (do more repetitions). If you do not have pain in your back, do these exercises once each day or as told by your doctor. Exercises Single Knee to Chest  Do these steps 3-5 times in a row for each leg: 1. Lie on your back on a firm bed or the floor with your legs stretched out. 2. Bring one knee to your chest. 3. Hold your knee to your chest by grabbing your knee or thigh. 4. Pull on your knee until you feel a gentle stretch in your lower back. 5. Keep doing the stretch for 10-30 seconds. 6. Slowly let go of your leg and straighten it.  Pelvic Tilt  Do these steps 5-10 times in a row: 1. Lie on your back on a firm bed or the floor with your legs stretched out. 2. Bend your knees so they point up to the ceiling. Your feet should be flat on the floor. 3. Tighten your lower belly (abdomen) muscles to press your lower back against the floor. This will make your tailbone point up to the ceiling instead of pointing down to your feet or the floor. 4. Stay in this position for 5-10 seconds while you gently tighten your muscles and breathe evenly.  Cat-Cow  Do these steps until your lower back bends more easily: 1. Get on your hands and knees on a firm surface. Keep your hands under your shoulders, and keep your knees under your hips. You may put padding under your knees. 2. Let your head hang down, and make your tailbone point down to the floor so your lower back is round like the back of a cat. 3. Stay in this position for 5 seconds. 4. Slowly lift your head and make your tailbone point up to the ceiling so your back hangs  low (sags) like the back of a cow. 5. Stay in this position for 5 seconds.  Press-Ups  Do these steps 5-10 times in a row: 1. Lie on your belly (face-down) on the floor. 2. Place your hands near your head, about shoulder-width apart. 3. While you keep your back relaxed and keep your hips on the floor, slowly straighten your arms to raise the top half of your body and lift your shoulders. Do not use your back muscles. To make yourself more comfortable, you may change where you place your hands. 4. Stay in this position for 5 seconds. 5. Slowly return to lying flat on the floor.  Bridges  Do these steps  10 times in a row: 1. Lie on your back on a firm surface. 2. Bend your knees so they point up to the ceiling. Your feet should be flat on the floor. 3. Tighten your butt muscles and lift your butt off of the floor until your waist is almost as high as your knees. If you do not feel the muscles working in your butt and the back of your thighs, slide your feet 1-2 inches farther away from your butt. 4. Stay in this position for 3-5 seconds. 5. Slowly lower your butt to the floor, and let your butt muscles relax.  If this exercise is too easy, try doing it with your arms crossed over your chest. Belly Crunches  Do these steps 5-10 times in a row: 1. Lie on your back on a firm bed or the floor with your legs stretched out. 2. Bend your knees so they point up to the ceiling. Your feet should be flat on the floor. 3. Cross your arms over your chest. 4. Tip your chin a little bit toward your chest but do not bend your neck. 5. Tighten your belly muscles and slowly raise your chest just enough to lift your shoulder blades a tiny bit off of the floor. 6. Slowly lower your chest and your head to the floor.  Back Lifts Do these steps 5-10 times in a row: 1. Lie on your belly (face-down) with your arms at your sides, and rest your forehead on the floor. 2. Tighten the muscles in your legs and  your butt. 3. Slowly lift your chest off of the floor while you keep your hips on the floor. Keep the back of your head in line with the curve in your back. Look at the floor while you do this. 4. Stay in this position for 3-5 seconds. 5. Slowly lower your chest and your face to the floor.  Contact a doctor if:  Your back pain gets a lot worse when you do an exercise.  Your back pain does not lessen 2 hours after you exercise. If you have any of these problems, stop doing the exercises. Do not do them again unless your doctor says it is okay. Get help right away if:  You have sudden, very bad back pain. If this happens, stop doing the exercises. Do not do them again unless your doctor says it is okay. This information is not intended to replace advice given to you by your health care provider. Make sure you discuss any questions you have with your health care provider. Document Released: 11/22/2010 Document Revised: 03/27/2016 Document Reviewed: 12/14/2014 Elsevier Interactive Patient Education  [employer].

## 2018-05-10 NOTE — Progress Notes (Signed)
Subjective:    Patient ID: Elizabeth Benson, female    DOB: 1980-12-13, 37 y.o.   MRN: [id number]  No chief complaint on file.   HPI Patient was seen today for f/u and acute concerns.  Anxiety and depression: -taking Zoloft 50 mg -notes improvement in mood, sleep, and energy -rx initially made pt feel jittery but after a wk this went away.  Vit D def: -pt did not take Ergocalciferol 50,000 IU wkly -states has been taking 2,000 IU BID -vit d level was 11.5 on 03/10/18  Skin concern: -dark circles around ankles -noticed a few days ago -area does not itch and no rash present. -no h/o allergies, no h/o bruising/injury  Back pain: -pt notes midline back pain x "a while" -pt tried Tylenol for her symptoms -pt denies heavy lifting or injury -does endorse poor posture and prolonged sitting at work. -also notes a heaviness in her leg like it's going to give out. -pt states even clothing touching her hurts.   Past Medical History:  Diagnosis Date  . Anxiety    no meds  . Depression    no meds  . Endometriosis   . History of cervical dysplasia    CIN I  . HSV infection   . Pelvic pain in female   . PONV (postoperative nausea and vomiting)   . SVD (spontaneous vaginal delivery)    x 2  . Thalassemia minor   . Thalassemia trait   . Wears glasses     No Known Allergies  ROS General: Denies fever, chills, night sweats, changes in weight, changes in appetite HEENT: Denies headaches, ear pain, changes in vision, rhinorrhea, sore throat CV: Denies CP, palpitations, SOB, orthopnea Pulm: Denies SOB, cough, wheezing GI: Denies abdominal pain, nausea, vomiting, diarrhea, constipation GU: Denies dysuria, hematuria, frequency, vaginal discharge Msk: Denies muscle cramps, joint pains  +back pain Neuro: Denies weakness, numbness, tingling Skin: Denies rashes, bruising  +skin changes b/l ankles Psych: Denies hallucinations  +anxiety and depression     Objective:     Blood pressure 110/80, pulse 88, temperature 98.4 F (36.9 C), temperature source Oral, weight 174 lb (78.9 kg), SpO2 98 %.   Gen. Pleasant, well-nourished, in no distress, normal affect   HEENT: [place]/AT, face symmetric, no scleral icterus, PERRLA, nares patent without drainage Cardiovascular: RRR, no peripheral edema Musculoskeletal: No deformities, no cyanosis or clubbing, normal tone.  TTP of thoracic spine. No paraspinal muscle tenderness. Neuro:  A&Ox3, CN II-XII intact, normal gait Skin:  Warm.  Dry, flaky skin of lateral ankles b/l.  Both ankles with hyperpigmentation at line of sock. No hyperpigmentation at waist.   Wt Readings from Last 3 Encounters:  05/10/18 174 lb (78.9 kg)  03/10/18 177 lb (80.3 kg)  10/13/17 186 lb (84.4 kg)    Lab Results  Component Value Date   WBC 5.1 03/10/2018   HGB 11.8 (L) 03/10/2018   HCT 37.1 03/10/2018   PLT 327.0 03/10/2018   GLUCOSE 86 03/10/2018   CHOL 188 03/10/2018   TRIG 85.0 03/10/2018   HDL 46.60 03/10/2018   LDLCALC 124 (H) 03/10/2018   ALT 11 12/29/2012   AST 12 12/29/2012   NA 138 03/10/2018   K 4.8 03/10/2018   CL 101 03/10/2018   CREATININE 0.78 03/10/2018   BUN 9 03/10/2018   CO2 28 03/10/2018   TSH 0.95 03/10/2018   HGBA1C 5.8 03/10/2018    Assessment/Plan:  Acute midline thoracic back pain -heat, massage, biofreeze, tylenol -pt encouraged  to improve posture -give handout -consider neurology referral in the future for pain from clothing touching her.  Contact dermatitis due to other agent, unspecified contact dermatitis type  -given handout - Plan: hydrocortisone 2.5 % cream  Hyperpigmentation of skin  - Plan: hydrocortisone 2.5 % cream  Anxiety and depression -PHQ 9 score 5  Was 20 on 03/10/18 -GAD 7 score 6.  Was 19 on 03/10/18  - Plan: sertraline (ZOLOFT) 50 MG tablet  Vitamin D deficiency -encouraged to pick up rx for Ergocalciferol 50,000 IU wkly. -pt advised to set an alarm in her phone.  F/u in  next 1-2 months  Grier Mitts, MD

## 2018-05-27 MED FILL — VIT D2 1.25 MG (50,000 UNIT: 1.25 MG | 84 days supply | Qty: 12 | Fill #0

## 2018-05-27 MED FILL — HYDROCORTISONE 2.5% CREAM: 2.5 | 7 days supply | Qty: 30 | Fill #0

## 2018-06-15 MED FILL — SERTRALINE HCL 50 MG TABLET: 50 | 30 days supply | Qty: 30 | Fill #2

## 2018-06-29 DIAGNOSIS — R102 Pelvic and perineal pain: Secondary | ICD-10-CM | POA: Diagnosis not present

## 2018-06-29 DIAGNOSIS — N849 Polyp of female genital tract, unspecified: Secondary | ICD-10-CM | POA: Diagnosis not present

## 2018-06-29 DIAGNOSIS — Z01411 Encounter for gynecological examination (general) (routine) with abnormal findings: Secondary | ICD-10-CM | POA: Diagnosis not present

## 2018-06-29 DIAGNOSIS — N803 Endometriosis of pelvic peritoneum: Secondary | ICD-10-CM | POA: Diagnosis not present

## 2018-06-29 DIAGNOSIS — Z6833 Body mass index (BMI) 33.0-33.9, adult: Secondary | ICD-10-CM | POA: Diagnosis not present

## 2018-07-19 MED FILL — ORILISSA 150 MG TABS: 150 | 28 days supply | Qty: 28 | Fill #0

## 2018-07-19 MED FILL — SULFAMETHOXAZOLE-TMP DS TAB: 800-160 | 3 days supply | Qty: 6 | Fill #0

## 2018-07-26 ENCOUNTER — Encounter: Payer: Self-pay | Admitting: Family Medicine

## 2018-08-04 ENCOUNTER — Other Ambulatory Visit: Payer: Self-pay | Admitting: Family Medicine

## 2018-08-04 DIAGNOSIS — M546 Pain in thoracic spine: Secondary | ICD-10-CM

## 2018-08-11 ENCOUNTER — Encounter: Payer: Self-pay | Admitting: Neurology

## 2018-08-17 DIAGNOSIS — N842 Polyp of vagina: Secondary | ICD-10-CM | POA: Diagnosis not present

## 2018-08-17 MED FILL — SERTRALINE HCL 50 MG TABLET: 50 | 30 days supply | Qty: 30 | Fill #3

## 2018-09-24 MED FILL — SERTRALINE HCL 50 MG TABLET: 50 | 90 days supply | Qty: 90 | Fill #0

## 2018-10-05 NOTE — Progress Notes (Addendum)
NEUROLOGY CONSULTATION NOTE  [name] MRN: [id number] DOB: 04/24/1981  Referring provider: Grier Mitts, MD Primary care provider: Grier Mitts, MD  Reason for consult:  pain  HISTORY OF PRESENT ILLNESS: Elizabeth Benson is a 37 year old right-handed female with depression and anxiety who presents for diffuse body pain.  History supplemented by referring provider's note.  She noticed pain over the past 3 years.  It would involve the entire body.  She reports a constant shooting and aching mid spinal thoracic pain.  She also endorses allodynia in which light touch to the right torso and either leg causes severe pain.  She also sometimes has swelling in hands and fingers.  She reports occasional shooting pains in the arms and legs.  She reports myalgias and arthralgias.  Sometimes, her back will "lock up".  While taking a shower she noticed some tingling in the back but typically no associated numbness or tingling. Overheating aggravates it.  Sometimes she has brief spells of positional vertigo with head movement.  She denies lightheadedness or anhydrosis.  She reports daytime fatigue.  She sleeps well.  She denies history of visual disturbance, unilateral weakness or gait/balance problems.  Her maternal uncle has rheumatoid arthritis.  Her paternal aunt has lupus.  As far as family history for neurologic conditions, there is Alzheimer's dementia on her mother's side of the family.    03/10/18 LABS:  TSH 0.95, D 11.50 (she took supplements).  PAST MEDICAL HISTORY: Past Medical History:  Diagnosis Date  . Anxiety    no meds  . Depression    no meds  . Endometriosis   . History of cervical dysplasia    CIN I  . HSV infection   . Pelvic pain in female   . PONV (postoperative nausea and vomiting)   . SVD (spontaneous vaginal delivery)    x 2  . Thalassemia minor   . Thalassemia trait   . Wears glasses     PAST SURGICAL HISTORY: Past Surgical History:  Procedure  Laterality Date  . LAPAROSCOPIC VAGINAL HYSTERECTOMY WITH SALPINGECTOMY Bilateral 10/12/2017   Procedure: LAPAROSCOPy, VAGINAL HYSTERECTOMY WITH BILATERAL  SALPINGECTOMY;  Surgeon: Ena Dawley, MD;  Location: [facility];  Service: Gynecology;  Laterality: Bilateral;  3 Hours  . LAPAROSCOPY  2002   for endometriosis  . LAPAROSCOPY  08/06/2012   Procedure: LAPAROSCOPY OPERATIVE;  Surgeon: Ena Dawley, MD;  Location: [facility];  Service: Gynecology;  Laterality: N/A;  Laparoscopic Pelvic Biopsies  . LAPAROSCOPY N/A 09/04/2015   Procedure: LAPAROSCOPY OPERATIVE with peritoneal biopsies;  Surgeon: Ena Dawley, MD;  Location: [facility];  Service: Gynecology;  Laterality: N/A;  . WISDOM TOOTH EXTRACTION  1990's    MEDICATIONS: Current Outpatient Medications on File Prior to Visit  Medication Sig Dispense Refill  . Multiple Vitamins-Calcium (ONE-A-DAY WOMENS PO) Take 1 tablet by mouth daily.    . sertraline (ZOLOFT) 50 MG tablet Take 1 tablet (50 mg total) by mouth daily. 90 tablet 1  . Vitamin D, Ergocalciferol, (DRISDOL) 50000 units CAPS capsule Take 1 capsule (50,000 Units total) by mouth every 7 (seven) days. 12 capsule 0   No current facility-administered medications on file prior to visit.     ALLERGIES: No Known Allergies  FAMILY HISTORY: Family History  Problem Relation Age of Onset  . Hypertension Mother   . Hypertension Maternal Aunt   . Diabetes Maternal Aunt   . Hypertension Maternal Uncle   . Diabetes Maternal Uncle   . Diabetes  Paternal Aunt   . Hypertension Paternal Aunt   . Cancer Paternal Aunt        breast  . Diabetes Paternal Uncle   . Hypertension Paternal Uncle   . Stroke Paternal Uncle   . Hypertension Maternal Grandmother   . Cancer Maternal Grandfather        prostate  . Hypertension Maternal Grandfather   . Stroke Maternal Grandfather   . Diabetes Paternal Grandmother   . Stroke Paternal Grandfather   . Hypertension Paternal  Grandfather     SOCIAL HISTORY: Social History   Socioeconomic History  . Marital status: Single    Spouse name: Not on file  . Number of children: Not on file  . Years of education: Not on file  . Highest education level: Not on file  Occupational History  . Not on file  Social Needs  . Financial resource strain: Not on file  . Food insecurity:    Worry: Not on file    Inability: Not on file  . Transportation needs:    Medical: Not on file    Non-medical: Not on file  Tobacco Use  . Smoking status: Former Smoker    Packs/day: 0.25    Years: 8.00    Pack years: 2.00    Types: Cigarettes    Last attempt to quit: 08/09/2011    Years since quitting: 7.1  . Smokeless tobacco: Never Used  Substance and Sexual Activity  . Alcohol use: Yes    Comment: occasional   . Drug use: No  . Sexual activity: Not Currently    Birth control/protection: None  Lifestyle  . Physical activity:    Days per week: Not on file    Minutes per session: Not on file  . Stress: Not on file  Relationships  . Social connections:    Talks on phone: Not on file    Gets together: Not on file    Attends religious service: Not on file    Active member of club or organization: Not on file    Attends meetings of clubs or organizations: Not on file    Relationship status: Not on file  . Intimate partner violence:    Fear of current or ex partner: Not on file    Emotionally abused: Not on file    Physically abused: Not on file    Forced sexual activity: Not on file  Other Topics Concern  . Not on file  Social History Narrative  . Not on file    REVIEW OF SYSTEMS: Constitutional: No fevers, chills, or sweats, no generalized fatigue, change in appetite Eyes: No visual changes, double vision, eye pain Ear, nose and throat: No hearing loss, ear pain, nasal congestion, sore throat Cardiovascular: No chest pain, palpitations Respiratory:  No shortness of breath at rest or with exertion,  wheezes GastrointestinaI: No nausea, vomiting, diarrhea, abdominal pain, fecal incontinence Genitourinary:  No dysuria, urinary retention or frequency Musculoskeletal:  No neck pain, back pain Integumentary: No rash, pruritus, skin lesions Neurological: as above Psychiatric: No depression, insomnia, anxiety Endocrine: No palpitations, fatigue, diaphoresis, mood swings, change in appetite, change in weight, increased thirst Hematologic/Lymphatic:  No purpura, petechiae. Allergic/Immunologic: no itchy/runny eyes, nasal congestion, recent allergic reactions, rashes  PHYSICAL EXAM: Blood pressure 106/68, pulse 83, height 5' (1.524 m), weight 176 lb (79.8 kg), SpO2 99 %. General: No acute distress.  Patient appears well-groomed.  Head:  Normocephalic/atraumatic Eyes:  fundi examined but not visualized Neck: supple, lower  paraspinal  tenderness, full range of motion Back: mid paraspinal tenderness Heart: regular rate and rhythm Lungs: Clear to auscultation bilaterally. Vascular: No carotid bruits. Neurological Exam: Mental status: alert and oriented to person, place, and time, recent and remote memory intact, fund of knowledge intact, attention and concentration intact, speech fluent and not dysarthric, language intact. Cranial nerves: CN I: not tested CN II: pupils equal, round and reactive to light, visual fields intact CN III, IV, VI:  full range of motion, no nystagmus, no ptosis CN V: facial sensation intact CN VII: upper and lower face symmetric CN VIII: hearing intact CN IX, X: gag intact, uvula midline CN XI: sternocleidomastoid and trapezius muscles intact CN XII: tongue midline Bulk & Tone: normal, no fasciculations. Motor:  5/5 throughout  Sensation:  Pinprick and vibration sensation intact. Deep Tendon Reflexes:  2+ throughout, toes downgoing.  Finger to nose testing:  Without dysmetria.  Heel to shin:  Without dysmetria.  Gait:  Normal station and stride.  Able to turn  and tandem walk. Romberg negative.  IMPRESSION: 1.  Neuralgias/myalgias/arthralgias. Objective neurologic exam normal with no prior history of symptoms to suggest CNS etiology.  Will check labs for autoimmune conditions and other potential causes of neuralgia or myalgia. 2.  BPPV  PLAN: 1.  Check labs for ANA with reflex, Sed Rate, B12, Lyme, CK, aldolase 2.  Further recommendations pending results.  Thank you for allowing me to take part in the care of this patient.  Metta Clines, DO  CC: Grier Mitts, MD

## 2018-10-07 ENCOUNTER — Other Ambulatory Visit (INDEPENDENT_AMBULATORY_CARE_PROVIDER_SITE_OTHER): Payer: 59

## 2018-10-07 ENCOUNTER — Ambulatory Visit: Payer: 59 | Admitting: Neurology

## 2018-10-07 ENCOUNTER — Encounter: Payer: Self-pay | Admitting: Neurology

## 2018-10-07 VITALS — BP 106/68 | HR 83 | Ht 60.0 in | Wt 176.0 lb

## 2018-10-07 DIAGNOSIS — M791 Myalgia, unspecified site: Secondary | ICD-10-CM | POA: Diagnosis not present

## 2018-10-07 DIAGNOSIS — R6889 Other general symptoms and signs: Secondary | ICD-10-CM

## 2018-10-07 DIAGNOSIS — M2550 Pain in unspecified joint: Secondary | ICD-10-CM

## 2018-10-07 DIAGNOSIS — M792 Neuralgia and neuritis, unspecified: Secondary | ICD-10-CM

## 2018-10-07 LAB — SEDIMENTATION RATE: Sed Rate: 53 mm/hr — ABNORMAL HIGH (ref 0–20)

## 2018-10-07 LAB — CK: CK TOTAL: 90 U/L (ref 7–177)

## 2018-10-07 NOTE — Patient Instructions (Addendum)
1.  We will check labs:  ANA with reflex, Sed Rate, B12, Lyme, CK, aldolase 2.  Further recommendations pending results.  Your provider has requested that you have labwork completed today. Please go to [facility] ([id number]) on the second floor of this building before leaving the office today. You do not need to check in. If you are not called within 15 minutes please check with the front desk.

## 2018-10-07 NOTE — Addendum Note (Signed)
Addended by: Clois Comber on: 10/07/2018 04:13 PM   Modules accepted: Orders

## 2018-10-08 LAB — ANA W/REFLEX: Anti Nuclear Antibody(ANA): NEGATIVE

## 2018-10-08 LAB — VITAMIN B12: Vitamin B-12: 269 pg/mL (ref 211–911)

## 2018-10-08 LAB — SPECIMEN STATUS REPORT

## 2018-10-09 LAB — ALDOLASE: Aldolase: 4.3 U/L (ref <=8.1)

## 2018-10-11 LAB — LYME AB/WESTERN BLOT REFLEX
LYME DISEASE AB, QUANT, IGM: <0.8 index (ref 0.00–0.79)
LYME IGG/IGM AB: 1.58 {ISR} — AB (ref 0.00–0.90)

## 2018-10-11 LAB — LYME, WESTERN BLOT, SERUM (REFLEXED)
IGG P41 AB.: ABSENT
IgG P18 Ab.: ABSENT
IgG P23 Ab.: ABSENT
IgG P28 Ab.: ABSENT
IgG P30 Ab.: ABSENT
IgG P39 Ab.: ABSENT
IgG P45 Ab.: ABSENT
IgG P58 Ab.: ABSENT
IgG P66 Ab.: ABSENT
IgG P93 Ab.: ABSENT
IgM P23 Ab.: ABSENT
IgM P39 Ab.: ABSENT
IgM P41 Ab.: ABSENT
LYME IGG WB: NEGATIVE
Lyme IgM Wb: NEGATIVE

## 2018-10-11 LAB — SPECIMEN STATUS REPORT

## 2018-10-20 ENCOUNTER — Telehealth: Payer: Self-pay

## 2018-10-20 NOTE — Telephone Encounter (Signed)
-----   Message from Pieter Partridge, DO sent at 10/19/2018  7:53 AM EST ----- B12 is in the low-normal range which may still cause symptoms of B12 deficiency (numbness and aches).  Recommend starting OTC B12 1010mcg supplement daily.  Otherwise, no specific cause of symptoms found on labs.  The sed rate (a nonspecific marker for inflammation) is slightly elevated but nothing significant and other inflammatory markers are negative.  Initial Lyme screen was positive but the follow up confirmatory test was negative.  So I do not believe she has Lyme disease.  No further neurologic testing warranted at this time.

## 2018-10-20 NOTE — Telephone Encounter (Signed)
Called and advised Pt of lab results 

## 2019-05-26 ENCOUNTER — Telehealth: Payer: Self-pay | Admitting: *Deleted

## 2019-05-26 ENCOUNTER — Encounter: Payer: Self-pay | Admitting: Family Medicine

## 2019-05-26 MED FILL — SERTRALINE HCL 50 MG TABLET: 50 | 30 days supply | Qty: 30 | Fill #0

## 2019-05-26 NOTE — Telephone Encounter (Signed)
Ok to provide 30 day supply until pt seen.

## 2019-05-26 NOTE — Telephone Encounter (Signed)
Rx called in to pharmacy. 

## 2019-05-26 NOTE — Telephone Encounter (Signed)
Pateint requesting a on Zoloft has an appointment scheduled 06/2019. Please advise

## 2019-06-27 ENCOUNTER — Encounter: Payer: 59 | Admitting: Family Medicine

## 2019-07-04 DIAGNOSIS — R399 Unspecified symptoms and signs involving the genitourinary system: Secondary | ICD-10-CM | POA: Diagnosis not present

## 2019-07-04 DIAGNOSIS — Z6834 Body mass index (BMI) 34.0-34.9, adult: Secondary | ICD-10-CM | POA: Diagnosis not present

## 2019-07-04 DIAGNOSIS — Z01419 Encounter for gynecological examination (general) (routine) without abnormal findings: Secondary | ICD-10-CM | POA: Diagnosis not present

## 2019-07-04 MED FILL — NITROFURANTOIN MONO-MCR 100: 100 | 3 days supply | Qty: 6 | Fill #0

## 2019-07-13 ENCOUNTER — Other Ambulatory Visit: Payer: Self-pay

## 2019-07-13 ENCOUNTER — Encounter: Payer: Self-pay | Admitting: Family Medicine

## 2019-07-13 ENCOUNTER — Ambulatory Visit (INDEPENDENT_AMBULATORY_CARE_PROVIDER_SITE_OTHER): Payer: 59 | Admitting: Family Medicine

## 2019-07-13 ENCOUNTER — Other Ambulatory Visit: Payer: Self-pay | Admitting: Family Medicine

## 2019-07-13 VITALS — BP 118/82 | HR 76 | Temp 98.4°F | Wt 179.0 lb

## 2019-07-13 DIAGNOSIS — F329 Major depressive disorder, single episode, unspecified: Secondary | ICD-10-CM

## 2019-07-13 DIAGNOSIS — Z131 Encounter for screening for diabetes mellitus: Secondary | ICD-10-CM | POA: Diagnosis not present

## 2019-07-13 DIAGNOSIS — Z0000 Encounter for general adult medical examination without abnormal findings: Secondary | ICD-10-CM | POA: Diagnosis not present

## 2019-07-13 DIAGNOSIS — E559 Vitamin D deficiency, unspecified: Secondary | ICD-10-CM

## 2019-07-13 DIAGNOSIS — F32A Depression, unspecified: Secondary | ICD-10-CM

## 2019-07-13 DIAGNOSIS — Z13220 Encounter for screening for lipoid disorders: Secondary | ICD-10-CM

## 2019-07-13 DIAGNOSIS — F419 Anxiety disorder, unspecified: Secondary | ICD-10-CM | POA: Diagnosis not present

## 2019-07-13 DIAGNOSIS — Z23 Encounter for immunization: Secondary | ICD-10-CM

## 2019-07-13 LAB — BASIC METABOLIC PANEL
BUN: 11 mg/dL (ref 6–23)
CO2: 28 mEq/L (ref 19–32)
Calcium: 9.1 mg/dL (ref 8.4–10.5)
Chloride: 103 mEq/L (ref 96–112)
Creatinine, Ser: 0.79 mg/dL (ref 0.40–1.20)
GFR: 97.76 mL/min (ref >60.00)
Glucose, Bld: 86 mg/dL (ref 70–99)
Potassium: 4.7 mEq/L (ref 3.5–5.1)
Sodium: 138 mEq/L (ref 135–145)

## 2019-07-13 LAB — LIPID PANEL
Cholesterol: 184 mg/dL (ref 0–200)
HDL: 48.1 mg/dL (ref >39.00)
LDL Cholesterol: 119 mg/dL — ABNORMAL HIGH (ref 0–99)
NonHDL: 135.61
Total CHOL/HDL Ratio: 4
Triglycerides: 82 mg/dL (ref 0.0–149.0)
VLDL: 16.4 mg/dL (ref 0.0–40.0)

## 2019-07-13 LAB — TSH: TSH: 0.82 u[IU]/mL (ref 0.35–4.50)

## 2019-07-13 LAB — T4, FREE: Free T4: 1.01 ng/dL (ref 0.60–1.60)

## 2019-07-13 LAB — CBC WITH DIFFERENTIAL/PLATELET
Basophils Absolute: 0 10*3/uL (ref 0.0–0.1)
Basophils Relative: 0.5 % (ref 0.0–3.0)
Eosinophils Absolute: 0.1 10*3/uL (ref 0.0–0.7)
Eosinophils Relative: 2.3 % (ref 0.0–5.0)
HCT: 37.8 % (ref 36.0–46.0)
Hemoglobin: 12.1 g/dL (ref 12.0–15.0)
Lymphocytes Relative: 33.7 % (ref 12.0–46.0)
Lymphs Abs: 1.5 10*3/uL (ref 0.7–4.0)
MCHC: 32 g/dL (ref 30.0–36.0)
MCV: 70.7 fl — ABNORMAL LOW (ref 78.0–100.0)
Monocytes Absolute: 0.4 10*3/uL (ref 0.1–1.0)
Monocytes Relative: 8.5 % (ref 3.0–12.0)
Neutro Abs: 2.5 10*3/uL (ref 1.4–7.7)
Neutrophils Relative %: 55 % (ref 43.0–77.0)
Platelets: 326 10*3/uL (ref 150.0–400.0)
RBC: 5.35 Mil/uL — ABNORMAL HIGH (ref 3.87–5.11)
RDW: 16.9 % — ABNORMAL HIGH (ref 11.5–15.5)
WBC: 4.6 10*3/uL (ref 4.0–10.5)

## 2019-07-13 LAB — VITAMIN D 25 HYDROXY (VIT D DEFICIENCY, FRACTURES): VITD: 15.43 ng/mL — ABNORMAL LOW (ref 30.00–100.00)

## 2019-07-13 LAB — HEMOGLOBIN A1C: Hgb A1c MFr Bld: 5.9 % (ref 4.6–6.5)

## 2019-07-13 MED ORDER — VITAMIN D (ERGOCALCIFEROL) 1.25 MG (50000 UNIT) PO CAPS: 50000.0000 [IU] | ORAL_CAPSULE | ORAL | 0 refills | Status: DC

## 2019-07-13 MED ORDER — SERTRALINE HCL 50 MG PO TABS: 50.0000 mg | ORAL_TABLET | Freq: Every day | ORAL | 1 refills | Status: DC

## 2019-07-13 MED FILL — SERTRALINE HCL 50 MG TABLET: 50 | 90 days supply | Qty: 90 | Fill #0

## 2019-07-13 MED FILL — VIT D2 1.25 MG (50,000 UNIT: 1.25 MG | 84 days supply | Qty: 12 | Fill #0

## 2019-07-13 NOTE — Progress Notes (Signed)
Subjective:     Elizabeth Benson is a 38 y.o. female and is here for a comprehensive physical exam. The patient reports no problems. Mood is up and down.  Sleep is ok.  Energy is good.  Pt is working full time, in school, helping her 2 kids with virtual learning, and taking care of her mother who has ovarian cancer.  Pt has started working out with a Physiological scientist.    Pap scheduled.  Social History   Socioeconomic History  . Marital status: Single    Spouse name: Not on file  . Number of children: 2  . Years of education: Not on file  . Highest education level: Bachelor's degree (e.g., BA, AB, BS)  Occupational History  . 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 Financial resource strain: Not on file  . Food insecurity    Worry: Not on file    Inability: Not on file  . Transportation needs    Medical: Not on file    Non-medical: Not on file  Tobacco Use  . Smoking status: Former Smoker    Packs/day: 0.25    Years: 8.00    Pack years: 2.00    Types: Cigarettes    Quit date: 08/09/2011    Years since quitting: 7.9  . Smokeless tobacco: Never Used  Substance and Sexual Activity  . Alcohol use: Yes    Comment: occasional   . Drug use: No  . Sexual activity: Not Currently    Birth control/protection: None  Lifestyle  . Physical activity    Days per week: Not on file    Minutes per session: Not on file  . Stress: Not on file  Relationships  . Social Herbalist on phone: Not on file    Gets together: Not on file    Attends religious service: Not on file    Active member of club or organization: Not on file    Attends meetings of clubs or organizations: Not on file    Relationship status: Not on file  . Intimate partner violence    Fear of current or ex partner: Not on file    Emotionally abused: Not on file    Physically abused: Not on file    Forced sexual activity: Not on file   Other Topics Concern  . Not on file  Social History Narrative   Patient is right-handed. She lives in a  One level home with her 2 children and her mother. She drinks 2 cups of coffee in the morning and 2 cups of decaf coffee in the afternoons. She drinks 32 oz of tea a day, 5 days a week. She walks her dog daily.   Health Maintenance  Topic Date Due  . HIV Screening  12/29/1994  . INFLUENZA VACCINE  06/04/2019  . PAP SMEAR-Modifier  07/31/2020  . TETANUS/TDAP  08/01/2027    The following portions of the patient's history were reviewed and updated as appropriate: allergies, current medications, past family history, past medical history, past social history, past surgical history and problem list.  Review of Systems Pertinent items noted in HPI and remainder of comprehensive ROS otherwise negative.   Objective:    BP 118/82 (BP Location: Left Arm, Patient Position: Sitting, Cuff Size: Normal)   Pulse 76   Temp 98.4 F (36.9 C) (Oral)   Wt 179 lb (81.2 kg)  LMP  (LMP Unknown)   SpO2 98%   BMI 34.96 kg/m  General appearance: alert, cooperative and no distress Head: Normocephalic, without obvious abnormality, atraumatic Eyes: conjunctivae/corneas clear. PERRL, EOM's intact. Fundi benign. Ears: normal TM's and external ear canals both ears Nose: Nares normal. Septum midline. Mucosa normal. No drainage or sinus tenderness. Throat: lips, mucosa, and tongue normal; teeth and gums normal Neck: no adenopathy, no carotid bruit, no JVD, supple, symmetrical, trachea midline and thyroid not enlarged, symmetric, no tenderness/mass/nodules Lungs: clear to auscultation bilaterally Heart: regular rate and rhythm, S1, S2 normal, no murmur, click, rub or gallop Abdomen: soft, non-tender; bowel sounds normal; no masses,  no organomegaly Extremities: extremities normal, atraumatic, no cyanosis or edema Pulses: 2+ and symmetric Skin: Skin color, texture, turgor normal. No rashes or  lesions Lymph nodes: Cervical, supraclavicular, and axillary nodes normal.   Neuro: A&Ox3.  CNII-XII grossly intact.  Normal gait.   Assessment:    Healthy female exam.     Plan:     Anticipatory guidance given including wearing seatbelts, smoke detectors in the home, increasing physical activity, increasing p.o. intake of water and vegetables. -will obtain labs -given handout -next CPE in 1 yr See After Visit Summary for Counseling Recommendations    Need for immunization against influenza  - Plan: Flu Vaccine QUAD 6+ mos PF IM (Fluarix Quad PF)  Vitamin D deficiency  - Plan: Vitamin D, 25-hydroxy  Anxiety and depression  -PHQ 9 score 15 -GAD 7 score 16 -consider counseling -continue exercising -given handout - Plan: TSH, T4, Free, sertraline (ZOLOFT) 50 MG tablet  Screening for diabetes mellitus  - Plan: Hemoglobin A1c  Screening for cholesterol level  - Plan: Lipid panel  F/u in 3 months, sooner if needed  Grier Mitts, MD

## 2019-07-13 NOTE — Patient Instructions (Signed)
Preventive Care 21-39 Years Old, Female Preventive care refers to visits with your health care provider and lifestyle choices that can promote health and wellness. This includes:  A yearly physical exam. This may also be called an annual well check.  Regular dental visits and eye exams.  Immunizations.  Screening for certain conditions.  Healthy lifestyle choices, such as eating a healthy diet, getting regular exercise, not using drugs or products that contain nicotine and tobacco, and limiting alcohol use. What can I expect for my preventive care visit? Physical exam Your health care provider will check your:  Height and weight. This may be used to calculate body mass index (BMI), which tells if you are at a healthy weight.  Heart rate and blood pressure.  Skin for abnormal spots. Counseling Your health care provider may ask you questions about your:  Alcohol, tobacco, and drug use.  Emotional well-being.  Home and relationship well-being.  Sexual activity.  Eating habits.  Work and work environment.  Method of birth control.  Menstrual cycle.  Pregnancy history. What immunizations do I need?  Influenza (flu) vaccine  This is recommended every year. Tetanus, diphtheria, and pertussis (Tdap) vaccine  You may need a Td booster every 10 years. Varicella (chickenpox) vaccine  You may need this if you have not been vaccinated. Human papillomavirus (HPV) vaccine  If recommended by your health care provider, you may need three doses over 6 months. Measles, mumps, and rubella (MMR) vaccine  You may need at least one dose of MMR. You may also need a second dose. Meningococcal conjugate (MenACWY) vaccine  One dose is recommended if you are age 19-21 years and a first-year college student living in a residence hall, or if you have one of several medical conditions. You may also need additional booster doses. Pneumococcal conjugate (PCV13) vaccine  You may need  this if you have certain conditions and were not previously vaccinated. Pneumococcal polysaccharide (PPSV23) vaccine  You may need one or two doses if you smoke cigarettes or if you have certain conditions. Hepatitis A vaccine  You may need this if you have certain conditions or if you travel or work in places where you may be exposed to hepatitis A. Hepatitis B vaccine  You may need this if you have certain conditions or if you travel or work in places where you may be exposed to hepatitis B. Haemophilus influenzae type b (Hib) vaccine  You may need this if you have certain conditions. You may receive vaccines as individual doses or as more than one vaccine together in one shot (combination vaccines). Talk with your health care provider about the risks and benefits of combination vaccines. What tests do I need?  Blood tests  Lipid and cholesterol levels. These may be checked every 5 years starting at age 20.  Hepatitis C test.  Hepatitis B test. Screening  Diabetes screening. This is done by checking your blood sugar (glucose) after you have not eaten for a while (fasting).  Sexually transmitted disease (STD) testing.  BRCA-related cancer screening. This may be done if you have a family history of breast, ovarian, tubal, or peritoneal cancers.  Pelvic exam and Pap test. This may be done every 3 years starting at age 21. Starting at age 30, this may be done every 5 years if you have a Pap test in combination with an HPV test. Talk with your health care provider about your test results, treatment options, and if necessary, the need for more tests.   Follow these instructions at home: Eating and drinking   Eat a diet that includes fresh fruits and vegetables, whole grains, lean protein, and low-fat dairy.  Take vitamin and mineral supplements as recommended by your health care provider.  Do not drink alcohol if: ? Your health care provider tells you not to drink. ? You are  pregnant, may be pregnant, or are planning to become pregnant.  If you drink alcohol: ? Limit how much you have to 0-1 drink a day. ? Be aware of how much alcohol is in your drink. In the [place] one drink equals one 12 oz bottle of beer (355 mL), one 5 oz glass of wine (148 mL), or one 1 oz glass of hard liquor (44 mL). Lifestyle  Take daily care of your teeth and gums.  Stay active. Exercise for at least 30 minutes on 5 or more days each week.  Do not use any products that contain nicotine or tobacco, such as cigarettes, e-cigarettes, and chewing tobacco. If you need help quitting, ask your health care provider.  If you are sexually active, practice safe sex. Use a condom or other form of birth control (contraception) in order to prevent pregnancy and STIs (sexually transmitted infections). If you plan to become pregnant, see your health care provider for a preconception visit. What's next?  Visit your health care provider once a year for a well check visit.  Ask your health care provider how often you should have your eyes and teeth checked.  Stay up to date on all vaccines. This information is not intended to replace advice given to you by your health care provider. Make sure you discuss any questions you have with your health care provider. Document Released: 12/16/2001 Document Revised: 07/01/2018 Document Reviewed: 07/01/2018 Elsevier Patient Education  2020 [employer]  Living With Anxiety  After being diagnosed with an anxiety disorder, you may be relieved to know why you have felt or behaved a certain way. It is natural to also feel overwhelmed about the treatment ahead and what it will mean for your life. With care and support, you can manage this condition and recover from it. How to cope with anxiety Dealing with stress Stress is your body's reaction to life changes and events, both good and bad. Stress can last just a few hours or it can be ongoing. Stress can play a  major role in anxiety, so it is important to learn both how to cope with stress and how to think about it differently. Talk with your health care provider or a counselor to learn more about stress reduction. He or she may suggest some stress reduction techniques, such as:  Music therapy. This can include creating or listening to music that you enjoy and that inspires you.  Mindfulness-based meditation. This involves being aware of your normal breaths, rather than trying to control your breathing. It can be done while sitting or walking.  Centering prayer. This is a kind of meditation that involves focusing on a word, phrase, or sacred image that is meaningful to you and that brings you peace.  Deep breathing. To do this, expand your stomach and inhale slowly through your nose. Hold your breath for 3-5 seconds. Then exhale slowly, allowing your stomach muscles to relax.  Self-talk. This is a skill where you identify thought patterns that lead to anxiety reactions and correct those thoughts.  Muscle relaxation. This involves tensing muscles then relaxing them. Choose a stress reduction technique that fits your lifestyle   and personality. Stress reduction techniques take time and practice. Set aside 5-15 minutes a day to do them. Therapists can offer training in these techniques. The training may be covered by some insurance plans. Other things you can do to manage stress include:  Keeping a stress diary. This can help you learn what triggers your stress and ways to control your response.  Thinking about how you respond to certain situations. You may not be able to control everything, but you can control your reaction.  Making time for activities that help you relax, and not feeling guilty about spending your time in this way. Therapy combined with coping and stress-reduction skills provides the best chance for successful treatment. Medicines Medicines can help ease symptoms. Medicines for anxiety  include:  Anti-anxiety drugs.  Antidepressants.  Beta-blockers. Medicines may be used as the main treatment for anxiety disorder, along with therapy, or if other treatments are not working. Medicines should be prescribed by a health care provider. Relationships Relationships can play a big part in helping you recover. Try to spend more time connecting with trusted friends and family members. Consider going to couples counseling, taking family education classes, or going to family therapy. Therapy can help you and others better understand the condition. How to recognize changes in your condition Everyone has a different response to treatment for anxiety. Recovery from anxiety happens when symptoms decrease and stop interfering with your daily activities at home or work. This may mean that you will start to:  Have better concentration and focus.  Sleep better.  Be less irritable.  Have more energy.  Have improved memory. It is important to recognize when your condition is getting worse. Contact your health care provider if your symptoms interfere with home or work and you do not feel like your condition is improving. Where to find help and support: You can get help and support from these sources:  Self-help groups.  Online and community organizations.  A trusted spiritual leader.  Couples counseling.  Family education classes.  Family therapy. Follow these instructions at home:  Eat a healthy diet that includes plenty of vegetables, fruits, whole grains, low-fat dairy products, and lean protein. Do not eat a lot of foods that are high in solid fats, added sugars, or salt.  Exercise. Most adults should do the following: ? Exercise for at least 150 minutes each week. The exercise should increase your heart rate and make you sweat (moderate-intensity exercise). ? Strengthening exercises at least twice a week.  Cut down on caffeine, tobacco, alcohol, and other potentially  harmful substances.  Get the right amount and quality of sleep. Most adults need 7-9 hours of sleep each night.  Make choices that simplify your life.  Take over-the-counter and prescription medicines only as told by your health care provider.  Avoid caffeine, alcohol, and certain over-the-counter cold medicines. These may make you feel worse. Ask your pharmacist which medicines to avoid.  Keep all follow-up visits as told by your health care provider. This is important. Questions to ask your health care provider  Would I benefit from therapy?  How often should I follow up with a health care provider?  How long do I need to take medicine?  Are there any long-term side effects of my medicine?  Are there any alternatives to taking medicine? Contact a health care provider if:  You have a hard time staying focused or finishing daily tasks.  You spend many hours a day feeling worried about everyday life.    You become exhausted by worry.  You start to have headaches, feel tense, or have nausea.  You urinate more than normal.  You have diarrhea. Get help right away if:  You have a racing heart and shortness of breath.  You have thoughts of hurting yourself or others. If you ever feel like you may hurt yourself or others, or have thoughts about taking your own life, get help right away. You can go to your nearest emergency department or call:  Your local emergency services (911 in the [place]  A suicide crisis helpline, such as the [facility] at 831-125-4475. This is open 24-hours a day. Summary  Taking steps to deal with stress can help calm you.  Medicines cannot cure anxiety disorders, but they can help ease symptoms.  Family, friends, and partners can play a big part in helping you recover from an anxiety disorder. This information is not intended to replace advice given to you by your health care provider. Make sure you discuss any questions  you have with your health care provider. Document Released: 10/14/2016 Document Revised: 10/02/2017 Document Reviewed: 10/14/2016 Elsevier Patient Education  2020 [place] With Depression Everyone experiences occasional disappointment, sadness, and loss in their lives. When you are feeling down, blue, or sad for at least 2 weeks in a row, it may mean that you have depression. Depression can affect your thoughts and feelings, relationships, daily activities, and physical health. It is caused by changes in the way your brain functions. If you receive a diagnosis of depression, your health care provider will tell you which type of depression you have and what treatment options are available to you. If you are living with depression, there are ways to help you recover from it and also ways to prevent it from coming back. How to cope with lifestyle changes Coping with stress     Stress is your body's reaction to life changes and events, both good and bad. Stressful situations may include:  Getting married.  The death of a spouse.  Losing a job.  Retiring.  Having a baby. Stress can last just a few hours or it can be ongoing. Stress can play a major role in depression, so it is important to learn both how to cope with stress and how to think about it differently. Talk with your health care provider or a counselor if you would like to learn more about stress reduction. He or she may suggest some stress reduction techniques, such as:  Music therapy. This can include creating music or listening to music. Choose music that you enjoy and that inspires you.  Mindfulness-based meditation. This kind of meditation can be done while sitting or walking. It involves being aware of your normal breaths, rather than trying to control your breathing.  Centering prayer. This is a kind of meditation that involves focusing on a spiritual word or phrase. Choose a word, phrase, or sacred image that  is meaningful to you and that brings you peace.  Deep breathing. To do this, expand your stomach and inhale slowly through your nose. Hold your breath for 3-5 seconds, then exhale slowly, allowing your stomach muscles to relax.  Muscle relaxation. This involves intentionally tensing muscles then relaxing them. Choose a stress reduction technique that fits your lifestyle and personality. Stress reduction techniques take time and practice to develop. Set aside 5-15 minutes a day to do them. Therapists can offer training in these techniques. The training may be covered  by some insurance plans. Other things you can do to manage stress include:  Keeping a stress diary. This can help you learn what triggers your stress and ways to control your response.  Understanding what your limits are and saying no to requests or events that lead to a schedule that is too full.  Thinking about how you respond to certain situations. You may not be able to control everything, but you can control how you react.  Adding humor to your life by watching funny films or TV shows.  Making time for activities that help you relax and not feeling guilty about spending your time this way.  Medicines Your health care provider may suggest certain medicines if he or she feels that they will help improve your condition. Avoid using alcohol and other substances that may prevent your medicines from working properly (may interact). It is also important to:  Talk with your pharmacist or health care provider about all the medicines that you take, their possible side effects, and what medicines are safe to take together.  Make it your goal to take part in all treatment decisions (shared decision-making). This includes giving input on the side effects of medicines. It is best if shared decision-making with your health care provider is part of your total treatment plan. If your health care provider prescribes a medicine, you may not  notice the full benefits of it for 4-8 weeks. Most people who are treated for depression need to be on medicine for at least 6-12 months after they feel better. If you are taking medicines as part of your treatment, do not stop taking medicines without first talking to your health care provider. You may need to have the medicine slowly decreased (tapered) over time to decrease the risk of harmful side effects. Relationships Your health care provider may suggest family therapy along with individual therapy and drug therapy. While there may not be family problems that are causing you to feel depressed, it is still important to make sure your family learns as much as they can about your mental health. Having your family's support can help make your treatment successful. How to recognize changes in your condition Everyone has a different response to treatment for depression. Recovery from major depression happens when you have not had signs of major depression for two months. This may mean that you will start to:  Have more interest in doing activities.  Feel less hopeless than you did 2 months ago.  Have more energy.  Overeat less often, or have better or improving appetite.  Have better concentration. Your health care provider will work with you to decide the next steps in your recovery. It is also important to recognize when your condition is getting worse. Watch for these signs:  Having fatigue or low energy.  Eating too much or too little.  Sleeping too much or too little.  Feeling restless, agitated, or hopeless.  Having trouble concentrating or making decisions.  Having unexplained physical complaints.  Feeling irritable, angry, or aggressive. Get help as soon as you or your family members notice these symptoms coming back. How to get support and help from others How to talk with friends and family members about your condition  Talking to friends and family members about your  condition can provide you with one way to get support and guidance. Reach out to trusted friends or family members, explain your symptoms to them, and let them know that you are working with a health  care provider to treat your depression. Financial resources Not all insurance plans cover mental health care, so it is important to check with your insurance carrier. If paying for co-pays or counseling services is a problem, search for a local or county mental health care center. They may be able to offer public mental health care services at low or no cost when you are not able to see a private health care provider. If you are taking medicine for depression, you may be able to get the generic form, which may be less expensive. Some makers of prescription medicines also offer help to patients who cannot afford the medicines they need. Follow these instructions at home:   Get the right amount and quality of sleep.  Cut down on using caffeine, tobacco, alcohol, and other potentially harmful substances.  Try to exercise, such as walking or lifting small weights.  Take over-the-counter and prescription medicines only as told by your health care provider.  Eat a healthy diet that includes plenty of vegetables, fruits, whole grains, low-fat dairy products, and lean protein. Do not eat a lot of foods that are high in solid fats, added sugars, or salt.  Keep all follow-up visits as told by your health care provider. This is important. Contact a health care provider if:  You stop taking your antidepressant medicines, and you have any of these symptoms: ? Nausea. ? Headache. ? Feeling lightheaded. ? Chills and body aches. ? Not being able to sleep (insomnia).  You or your friends and family think your depression is getting worse. Get help right away if:  You have thoughts of hurting yourself or others. If you ever feel like you may hurt yourself or others, or have thoughts about taking your own  life, get help right away. You can go to your nearest emergency department or call:  Your local emergency services (911 in the [place]  A suicide crisis helpline, such as the [facility] at 585-271-7554. This is open 24-hours a day. Summary  If you are living with depression, there are ways to help you recover from it and also ways to prevent it from coming back.  Work with your health care team to create a management plan that includes counseling, stress management techniques, and healthy lifestyle habits. This information is not intended to replace advice given to you by your health care provider. Make sure you discuss any questions you have with your health care provider. Document Released: 09/22/2016 Document Revised: 02/11/2019 Document Reviewed: 09/22/2016 Elsevier Patient Education  2020 [employer].

## 2019-07-14 DIAGNOSIS — H5213 Myopia, bilateral: Secondary | ICD-10-CM | POA: Diagnosis not present

## 2019-08-12 MED FILL — VIT D2 1.25 MG (50,000 UNIT: 1.25 MG | 84 days supply | Qty: 12 | Fill #0

## 2019-08-12 MED FILL — SERTRALINE HCL 50 MG TABLET: 50 | 90 days supply | Qty: 90 | Fill #0

## 2019-11-08 ENCOUNTER — Encounter: Payer: Self-pay | Admitting: Family Medicine

## 2019-11-10 ENCOUNTER — Other Ambulatory Visit: Payer: Self-pay

## 2019-11-10 ENCOUNTER — Telehealth (INDEPENDENT_AMBULATORY_CARE_PROVIDER_SITE_OTHER): Payer: 59 | Admitting: Family Medicine

## 2019-11-10 ENCOUNTER — Encounter: Payer: Self-pay | Admitting: Family Medicine

## 2019-11-10 VITALS — Wt 175.0 lb

## 2019-11-10 DIAGNOSIS — R519 Headache, unspecified: Secondary | ICD-10-CM | POA: Diagnosis not present

## 2019-11-10 DIAGNOSIS — R202 Paresthesia of skin: Secondary | ICD-10-CM

## 2019-11-10 DIAGNOSIS — R52 Pain, unspecified: Secondary | ICD-10-CM

## 2019-11-10 DIAGNOSIS — M549 Dorsalgia, unspecified: Secondary | ICD-10-CM

## 2019-11-10 NOTE — Progress Notes (Signed)
Virtual Visit via Video Note  I connected with Elizabeth Benson  on 11/10/19 at 10:00 AM EST by a video enabled telemedicine application and verified that I am speaking with the correct person using two identifiers.  Location patient: home Location provider:work or home office Persons participating in the virtual visit: patient, provider  I discussed the limitations of evaluation and management by telemedicine and the availability of in person appointments. The patient expressed understanding and agreed to proceed.   HPI:  Acute visit for back pain: -reports this really has been going on for years, thought it was stress and having kids -symptoms include: she reports intermittent episodes of pain throughout the body to the point that she feels like she does not even want to wear clothes, she gets numbness in the face, tingling in the fingers, headache, fatigue, back pain, chest pain all together with these spells - these can occur about twice per month and can last about 2 days, and then feels like has a headache for a few more days -denies:fevers, weight loss, neuro deficits, illness in between episodes, menstrual irr (no longer has periods as had a hysterectomy) -per brief review of chart has had extensive labs in the past with neurology for aches and numbness, saw PCP for back pain last year, looks like has had low B12, very low Vit D and microcytosis on labs in the past, also a mildly elevated ESR, though per neurology note was not felt to be sig. Also has PMH Depression and is taking zoloft.  -she no longer takes VIt D or B12  ROS: See pertinent positives and negatives per HPI.  Past Medical History:  Diagnosis Date  . Anxiety    no meds  . Depression    no meds  . Endometriosis   . History of cervical dysplasia    CIN I  . HSV infection   . Pelvic pain in female   . PONV (postoperative nausea and vomiting)   . SVD (spontaneous vaginal delivery)    x 2  . Thalassemia minor   .  Thalassemia trait   . Wears glasses     Past Surgical History:  Procedure Laterality Date  . LAPAROSCOPIC VAGINAL HYSTERECTOMY WITH SALPINGECTOMY Bilateral 10/12/2017   Procedure: LAPAROSCOPy, VAGINAL HYSTERECTOMY WITH BILATERAL  SALPINGECTOMY;  Surgeon: Ena Dawley, MD;  Location: [facility];  Service: Gynecology;  Laterality: Bilateral;  3 Hours  . LAPAROSCOPY  2002   for endometriosis  . LAPAROSCOPY  08/06/2012   Procedure: LAPAROSCOPY OPERATIVE;  Surgeon: Ena Dawley, MD;  Location: [facility];  Service: Gynecology;  Laterality: N/A;  Laparoscopic Pelvic Biopsies  . LAPAROSCOPY N/A 09/04/2015   Procedure: LAPAROSCOPY OPERATIVE with peritoneal biopsies;  Surgeon: Ena Dawley, MD;  Location: [facility];  Service: Gynecology;  Laterality: N/A;  . WISDOM TOOTH EXTRACTION  1990's    Family History  Problem Relation Age of Onset  . Hypertension Mother   . Ovarian cancer Mother   . Diabetes Mother   . Hypertension Maternal Aunt   . Diabetes Maternal Aunt   . Hypertension Maternal Uncle   . Diabetes Maternal Uncle   . Diabetes Paternal Aunt   . Hypertension Paternal Aunt   . Cancer Paternal Aunt        breast  . Diabetes Paternal Uncle   . Hypertension Paternal Uncle   . Stroke Paternal Uncle   . Hypertension Maternal Grandmother   . Cancer Maternal Grandfather        prostate  .  Hypertension Maternal Grandfather   . Stroke Maternal Grandfather   . Diabetes Paternal Grandmother   . Stroke Paternal Grandfather   . Hypertension Paternal Grandfather   . Hypertension Father   . High Cholesterol Father     SOCIAL HX: see hpi   Current Outpatient Medications:  .  sertraline (ZOLOFT) 50 MG tablet, Take 1 tablet (50 mg total) by mouth daily., Disp: 90 tablet, Rfl: 1  EXAM:  VITALS per patient if applicable:denies fevers  GENERAL: alert, oriented, appears well and in no acute distress  HEENT: atraumatic, conjunttiva clear, no obvious abnormalities on  inspection of external nose and ears  NECK: normal movements of the head and neck  LUNGS: on inspection no signs of respiratory distress, breathing rate appears normal, no obvious gross SOB, gasping or wheezing  CV: no obvious cyanosis  MS: moves all visible extremities without noticeable abnormality  PSYCH/NEURO: pleasant and cooperative, no obvious depression or anxiety, speech and thought processing grossly intact  ASSESSMENT AND PLAN:  Discussed the following assessment and plan:  Back pain, unspecified back location, unspecified back pain laterality, unspecified chronicity  Frequent headaches  Paresthesia  Total body pain  -we discussed possible serious and likely etiologies, options for evaluation and workup, limitations of telemedicine visit vs in person visit, treatment, treatment risks and precautions. Query atypical migraines, vit or nutritional deficiency, anxiety or depression vs other. Advised restarting Vit D 2000 IU daily of Vit D3, B12 572mg daily, healthy diet with 3-5 servings of iron rich foods daily, follow up with neurology about the headaches and possible atypical migraine and PCP in person exam for neuro and lymph node exam, possible repeat blood work, possible EKG/CXR if not done prior. Patient agrees with this plan and agrees to call neuro, start Vit D3, b12, etc and see PCP for inperson eval. Message sent to schedulers to assist with PCP appt. Advised to seek care promptly if worsening or new symptoms in the interim.   I discussed the assessment and treatment plan with the patient. The patient was provided an opportunity to ask questions and all were answered. The patient agreed with the plan and demonstrated an understanding of the instructions.   The patient was advised to call back or seek an in-person evaluation if the symptoms worsen or if the condition fails to improve as anticipated.   HLucretia Kern DO

## 2019-11-10 NOTE — Patient Instructions (Addendum)
-  please start Vit D3 1000-2000 IU daily. I like the [employer] Vit D3 drops.  -please take 542mcg of B12 daily  -eat a healthy diet with plenty of veggies and lean proteins and include 3-5 servings of iron rich foods daily  -call your neurologist for re-evaluation and for evaluation of the headaches along with the other symptoms.  -please schedule an in person exam with Dr. Volanda Napoleon for these issues as will for exam and possibly further testing  I hope you are feeling better soon! Seek care promptly if your symptoms worsen, new concerns arise or you are not improving with treatment.

## 2019-12-14 ENCOUNTER — Ambulatory Visit: Payer: 59 | Admitting: Family Medicine

## 2019-12-20 ENCOUNTER — Other Ambulatory Visit: Payer: Self-pay

## 2019-12-21 ENCOUNTER — Encounter: Payer: Self-pay | Admitting: Family Medicine

## 2019-12-21 ENCOUNTER — Ambulatory Visit (INDEPENDENT_AMBULATORY_CARE_PROVIDER_SITE_OTHER): Payer: 59 | Admitting: Family Medicine

## 2019-12-21 VITALS — BP 102/62 | HR 52 | Temp 97.8°F | Wt 175.0 lb

## 2019-12-21 DIAGNOSIS — R519 Headache, unspecified: Secondary | ICD-10-CM | POA: Diagnosis not present

## 2019-12-21 DIAGNOSIS — M791 Myalgia, unspecified site: Secondary | ICD-10-CM

## 2019-12-21 DIAGNOSIS — M2550 Pain in unspecified joint: Secondary | ICD-10-CM

## 2019-12-21 DIAGNOSIS — F419 Anxiety disorder, unspecified: Secondary | ICD-10-CM | POA: Diagnosis not present

## 2019-12-21 LAB — CBC
HCT: 36.7 % (ref 36.0–46.0)
Hemoglobin: 11.8 g/dL — ABNORMAL LOW (ref 12.0–15.0)
MCHC: 32 g/dL (ref 30.0–36.0)
MCV: 70.4 fl — ABNORMAL LOW (ref 78.0–100.0)
Platelets: 311 10*3/uL (ref 150.0–400.0)
RBC: 5.22 Mil/uL — ABNORMAL HIGH (ref 3.87–5.11)
RDW: 16.8 % — ABNORMAL HIGH (ref 11.5–15.5)
WBC: 4.6 10*3/uL (ref 4.0–10.5)

## 2019-12-21 LAB — VITAMIN D 25 HYDROXY (VIT D DEFICIENCY, FRACTURES): VITD: 15.56 ng/mL — ABNORMAL LOW (ref 30.00–100.00)

## 2019-12-21 LAB — VITAMIN B12: Vitamin B-12: 227 pg/mL (ref 211–911)

## 2019-12-21 LAB — FOLATE: Folate: 6.7 ng/mL (ref >5.9)

## 2019-12-21 LAB — T4, FREE: Free T4: 1.02 ng/dL (ref 0.60–1.60)

## 2019-12-21 LAB — HEMOGLOBIN A1C: Hgb A1c MFr Bld: 6.4 % (ref 4.6–6.5)

## 2019-12-21 LAB — TSH: TSH: 1.06 u[IU]/mL (ref 0.35–4.50)

## 2019-12-21 LAB — CK: Total CK: 71 U/L (ref 7–177)

## 2019-12-21 NOTE — Patient Instructions (Signed)
Muscle Pain, Adult Muscle pain (myalgia) may be mild or severe. In most cases, the pain lasts only a short time and it goes away without treatment. It is normal to feel some muscle pain after starting a workout program. Muscles that have not been used often will be sore at first. Muscle pain may also be caused by many other things, including:  Overuse or muscle strain, especially if you are not in shape. This is the most common cause of muscle pain.  Injury.  Bruises.  Viruses, such as the flu.  Infectious diseases.  A chronic condition that causes muscle tenderness, fatigue, and headache (fibromyalgia).  A condition, such as lupus, in which the body's disease-fighting system attacks other organs in the body (autoimmune or rheumatologic diseases).  Certain drugs, including ACE inhibitors and statins. To diagnose the cause of your muscle pain, your health care provider will do a physical exam and ask questions about the pain and when it began. If you have not had muscle pain for very long, your health care provider may want to wait before doing much testing. If your muscle pain has lasted a long time, your health care provider may want to run tests right away. In some cases, this may include tests to rule out certain conditions or illnesses. Treatment for muscle pain depends on the cause. Home care is often enough to relieve muscle pain. Your health care provider may also prescribe anti-inflammatory medicine. Follow these instructions at home: Activity  If overuse is causing your muscle pain: ? Slow down your activities until the pain goes away. ? Do regular, gentle exercises if you are not usually active. ? Warm up before exercising. Stretch before and after exercising. This can help lower the risk of muscle pain.  Do not continue working out if the pain is very bad. Bad pain could mean that you have injured a muscle. Managing pain and discomfort   If directed, apply ice to the sore  muscle: ? Put ice in a plastic bag. ? Place a towel between your skin and the bag. ? Leave the ice on for 20 minutes, 2-3 times a day.  You may also alternate between applying ice and applying heat as told by your health care provider. To apply heat, use the heat source that your health care provider recommends, such as a moist heat pack or a heating pad. ? Place a towel between your skin and the heat source. ? Leave the heat on for 20-30 minutes. ? Remove the heat if your skin turns bright red. This is especially important if you are unable to feel pain, heat, or cold. You may have a greater risk of getting burned. Medicines  Take over-the-counter and prescription medicines only as told by your health care provider.  Do not drive or use heavy machinery while taking prescription pain medicine. Contact a health care provider if:  Your muscle pain gets worse and medicines do not help.  You have muscle pain that lasts longer than 3 days.  You have a rash or fever along with muscle pain.  You have muscle pain after a tick bite.  You have muscle pain while working out, even though you are in good physical condition.  You have redness, soreness, or swelling along with muscle pain.  You have muscle pain after starting a new medicine or changing the dose of a medicine. Get help right away if:  You have trouble breathing.  You have trouble swallowing.  You have muscle   pain along with a stiff neck, fever, and vomiting.  You have severe muscle weakness or cannot move part of your body. This information is not intended to replace advice given to you by your health care provider. Make sure you discuss any questions you have with your health care provider. Document Revised: 10/02/2017 Document Reviewed: 03/11/2016 Elsevier Patient Education  [place].  Joint Pain Joint pain (arthralgia) may be caused by many things. Joint pain is likely to go away when you follow instructions  from your health care provider for relieving pain at home. However, joint pain can also be caused by conditions that require more treatment. Common causes of joint pain include:  Bruising in the area of the joint.  Injury caused by repeating certain movements too many times (overuse injury).  Age-related joint wear and tear (osteoarthritis).  Buildup of uric acid crystals in the joint (gout).  Inflammation of the joint (rheumatic disease).  Various other forms of arthritis.  Infections of the joint (septic arthritis) or of the bone (osteomyelitis). Your health care provider may recommend that you take pain medicine or wear a supportive device like an elastic bandage, sling, or splint. If your joint pain continues, you may need lab or imaging tests to diagnose the cause of your joint pain. Follow these instructions at home: Managing pain, stiffness, and swelling   If directed, put ice on the painful area. Icing can help to relieve joint pain and swelling. ? Put ice in a plastic bag. ? Place a towel between your skin and the bag. ? Leave the ice on for 20 minutes, 2-3 times a day.  If directed, apply heat to the painful area as often as told by your health care provider. Heat can reduce the stiffness of your muscles and joints. Use the heat source that your health care provider recommends, such as a moist heat pack or a heating pad. ? Place a towel between your skin and the heat source. ? Leave the heat on for 20-30 minutes. ? Remove the heat if your skin turns bright red. This is especially important if you are unable to feel pain, heat, or cold. You may have a greater risk of getting burned.  Move your fingers or toes below the painful joint often. You can avoid stiffness and lessen swelling by doing this.  If possible, raise (elevate) the painful joint above the level of your heart while you are sitting or lying down. To do this, try putting a few pillows under the painful joint.  Activity  Rest the painful joint for as long as directed. Do not do anything that causes or worsens pain.  Begin exercising or stretching the affected area, as told by your health care provider. Ask your health care provider what types of exercise are safe for you. If you have an elastic bandage, sling, or splint:  Wear the supportive device as told by your health care provider. Remove it only as told by your health care provider.  Loosen the device if your fingers or toes below the joint tingle, become numb, or turn cold and blue.  Keep the device clean.  Ask your health care provider if you should remove the device before bathing. You may need to cover it with a watertight covering when you take a bath or a shower. General instructions  Take over-the-counter and prescription medicines only as told by your health care provider.  Do not use any products that contain nicotine or tobacco, such as  cigarettes and e-cigarettes. If you need help quitting, ask your health care provider.  Keep all follow-up visits as told by your health care provider. This is important. Contact a health care provider if:  You have pain that gets worse and does not get better with medicine.  Your joint pain does not improve within 3 days.  You have increased bruising or swelling.  You have a fever.  You lose 10 lb (4.5 kg) or more without trying. Get help right away if:  You cannot move the joint.  Your fingers or toes tingle, become numb, or turn cold and blue.  You have a fever along with a joint that is red, warm, and swollen. Summary  Joint pain (arthralgia) may be caused by many things.  Your health care provider may recommend that you take pain medicine or wear a supportive device like an elastic bandage, sling, or splint.  If your joint pain continues, you may need tests to diagnose the cause of your joint pain.  Take over-the-counter and prescription medicines only as told by your  health care provider. This information is not intended to replace advice given to you by your health care provider. Make sure you discuss any questions you have with your health care provider. Document Revised: 10/02/2017 Document Reviewed: 08/05/2017 Elsevier Patient Education  [place] Headache Without Cause A headache is pain or discomfort felt around the head or neck area. The specific cause of a headache may not be found. There are many causes and types of headaches. A few common ones are:  Tension headaches.  Migraine headaches.  Cluster headaches.  Chronic daily headaches. Follow these instructions at home: Watch your condition for any changes. Let your health care provider know about them. Take these steps to help with your condition: Managing pain      Take over-the-counter and prescription medicines only as told by your health care provider.  Lie down in a dark, quiet room when you have a headache.  If directed, put ice on your head and neck area: ? Put ice in a plastic bag. ? Place a towel between your skin and the bag. ? Leave the ice on for 20 minutes, 2-3 times per day.  If directed, apply heat to the affected area. Use the heat source that your health care provider recommends, such as a moist heat pack or a heating pad. ? Place a towel between your skin and the heat source. ? Leave the heat on for 20-30 minutes. ? Remove the heat if your skin turns bright red. This is especially important if you are unable to feel pain, heat, or cold. You may have a greater risk of getting burned.  Keep lights dim if bright lights bother you or make your headaches worse. Eating and drinking  Eat meals on a regular schedule.  If you drink alcohol: ? Limit how much you use to:  0-1 drink a day for women.  0-2 drinks a day for men. ? Be aware of how much alcohol is in your drink. In the [place] one drink equals one 12 oz bottle of beer (355 mL), one 5 oz  glass of wine (148 mL), or one 1 oz glass of hard liquor (44 mL).  Stop drinking caffeine, or decrease the amount of caffeine you drink. General instructions   Keep a headache journal to help find out what may trigger your headaches. For example, write down: ? What you eat and drink. ? How  much sleep you get. ? Any change to your diet or medicines.  Try massage or other relaxation techniques.  Limit stress.  Sit up straight, and do not tense your muscles.  Do not use any products that contain nicotine or tobacco, such as cigarettes, e-cigarettes, and chewing tobacco. If you need help quitting, ask your health care provider.  Exercise regularly as told by your health care provider.  Sleep on a regular schedule. Get 7-9 hours of sleep each night, or the amount recommended by your health care provider.  Keep all follow-up visits as told by your health care provider. This is important. Contact a health care provider if:  Your symptoms are not helped by medicine.  You have a headache that is different from the usual headache.  You have nausea or you vomit.  You have a fever. Get help right away if:  Your headache becomes severe quickly.  Your headache gets worse after moderate to intense physical activity.  You have repeated vomiting.  You have a stiff neck.  You have a loss of vision.  You have problems with speech.  You have pain in the eye or ear.  You have muscular weakness or loss of muscle control.  You lose your balance or have trouble walking.  You feel faint or pass out.  You have confusion.  You have a seizure. Summary  A headache is pain or discomfort felt around the head or neck area.  There are many causes and types of headaches. In some cases, the cause may not be found.  Keep a headache journal to help find out what may trigger your headaches. Watch your condition for any changes. Let your health care provider know about them.  Contact a  health care provider if you have a headache that is different from the usual headache, or if your symptoms are not helped by medicine.  Get help right away if your headache becomes severe, you vomit, you have a loss of vision, you lose your balance, or you have a seizure. This information is not intended to replace advice given to you by your health care provider. Make sure you discuss any questions you have with your health care provider. Document Revised: 05/10/2018 Document Reviewed: 05/10/2018 Elsevier Patient Education  [place], Adult After being diagnosed with an anxiety disorder, you may be relieved to know why you have felt or behaved a certain way. You may also feel overwhelmed about the treatment ahead and what it will mean for your life. With care and support, you can manage this condition and recover from it. How to manage lifestyle changes Managing stress and anxiety  Stress is your body's reaction to life changes and events, both good and bad. Most stress will last just a few hours, but stress can be ongoing and can lead to more than just stress. Although stress can play a major role in anxiety, it is not the same as anxiety. Stress is usually caused by something external, such as a deadline, test, or competition. Stress normally passes after the triggering event has ended.  Anxiety is caused by something internal, such as imagining a terrible outcome or worrying that something will go wrong that will devastate you. Anxiety often does not go away even after the triggering event is over, and it can become long-term (chronic) worry. It is important to understand the differences between stress and anxiety and to manage your stress effectively so that it does not lead  to an anxious response. Talk with your health care provider or a counselor to learn more about reducing anxiety and stress. He or she may suggest tension reduction techniques, such as:  Music  therapy. This can include creating or listening to music that you enjoy and that inspires you.  Mindfulness-based meditation. This involves being aware of your normal breaths while not trying to control your breathing. It can be done while sitting or walking.  Centering prayer. This involves focusing on a word, phrase, or sacred image that means something to you and brings you peace.  Deep breathing. To do this, expand your stomach and inhale slowly through your nose. Hold your breath for 3-5 seconds. Then exhale slowly, letting your stomach muscles relax.  Self-talk. This involves identifying thought patterns that lead to anxiety reactions and changing those patterns.  Muscle relaxation. This involves tensing muscles and then relaxing them. Choose a tension reduction technique that suits your lifestyle and personality. These techniques take time and practice. Set aside 5-15 minutes a day to do them. Therapists can offer counseling and training in these techniques. The training to help with anxiety may be covered by some insurance plans. Other things you can do to manage stress and anxiety include:  Keeping a stress/anxiety diary. This can help you learn what triggers your reaction and then learn ways to manage your response.  Thinking about how you react to certain situations. You may not be able to control everything, but you can control your response.  Making time for activities that help you relax and not feeling guilty about spending your time in this way.  Visual imagery and yoga can help you stay calm and relax.  Medicines Medicines can help ease symptoms. Medicines for anxiety include:  Anti-anxiety drugs.  Antidepressants. Medicines are often used as a primary treatment for anxiety disorder. Medicines will be prescribed by a health care provider. When used together, medicines, psychotherapy, and tension reduction techniques may be the most effective treatment. Relationships  Relationships can play a big part in helping you recover. Try to spend more time connecting with trusted friends and family members. Consider going to couples counseling, taking family education classes, or going to family therapy. Therapy can help you and others better understand your condition. How to recognize changes in your anxiety Everyone responds differently to treatment for anxiety. Recovery from anxiety happens when symptoms decrease and stop interfering with your daily activities at home or work. This may mean that you will start to:  Have better concentration and focus. Worry will interfere less in your daily thinking.  Sleep better.  Be less irritable.  Have more energy.  Have improved memory. It is important to recognize when your condition is getting worse. Contact your health care provider if your symptoms interfere with home or work and you feel like your condition is not improving. Follow these instructions at home: Activity  Exercise. Most adults should do the following: ? Exercise for at least 150 minutes each week. The exercise should increase your heart rate and make you sweat (moderate-intensity exercise). ? Strengthening exercises at least twice a week.  Get the right amount and quality of sleep. Most adults need 7-9 hours of sleep each night. Lifestyle   Eat a healthy diet that includes plenty of vegetables, fruits, whole grains, low-fat dairy products, and lean protein. Do not eat a lot of foods that are high in solid fats, added sugars, or salt.  Make choices that simplify your life.  Do  not use any products that contain nicotine or tobacco, such as cigarettes, e-cigarettes, and chewing tobacco. If you need help quitting, ask your health care provider.  Avoid caffeine, alcohol, and certain over-the-counter cold medicines. These may make you feel worse. Ask your pharmacist which medicines to avoid. General instructions  Take over-the-counter and  prescription medicines only as told by your health care provider.  Keep all follow-up visits as told by your health care provider. This is important. Where to find support You can get help and support from these sources:  Self-help groups.  Online and [employer].  A trusted spiritual leader.  Couples counseling.  Family education classes.  Family therapy. Where to find more information You may find that joining a support group helps you deal with your anxiety. The following sources can help you locate counselors or support groups near you:  [place]: www.mentalhealthamerica.net  Anxiety and Depression Association of [place] (ADAA): https://www.[place].net/  National Alliance on Mental Illness (NAMI): www.nami.org Contact a health care provider if you:  Have a hard time staying focused or finishing daily tasks.  Spend many hours a day feeling worried about everyday life.  Become exhausted by worry.  Start to have headaches, feel tense, or have nausea.  Urinate more than normal.  Have diarrhea. Get help right away if you have:  A racing heart and shortness of breath.  Thoughts of hurting yourself or others. If you ever feel like you may hurt yourself or others, or have thoughts about taking your own life, get help right away. You can go to your nearest emergency department or call:  Your local emergency services (911 in the [place]  A suicide crisis helpline, such as the [place] at 3027302645. This is open 24 hours a day. Summary  Taking steps to learn and use tension reduction techniques can help calm you and help prevent triggering an anxiety reaction.  When used together, medicines, psychotherapy, and tension reduction techniques may be the most effective treatment.  Family, friends, and partners can play a big part in helping you recover from an anxiety disorder. This information is not intended to replace advice  given to you by your health care provider. Make sure you discuss any questions you have with your health care provider. Document Revised: 03/22/2019 Document Reviewed: 03/22/2019 Elsevier Patient Education  [place].

## 2019-12-21 NOTE — Progress Notes (Signed)
Subjective:    Patient ID: Elizabeth Benson, female    DOB: 09-27-1981, 39 y.o.   MRN: [id number]  No chief complaint on file.   HPI Patient was seen today for ongoing concern.  Pt notes body pain x at least 5 yrs.  Pt may have an episode of shooting pain in legs or hands monthly and may last 2-3 days.  Pt endorses pain in joints and muscles making it difficult to get out of bed.  Worse in cold weather.  Pt also notes wkly HA with pain behind eyes.  Had vision checked.  Wears glasses to see far away.  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  On Zoloft 50 mg, tried to wean herself off by taking qod but noticed became agitated.  Episodes of waking up gasping have improved since on Zoloft.  Denies snoring unless "really tired".  Pt denies stress, swelling of joints, rash, numbness, tingling.  Pt's Maternal Uncle has a h/o RA and polymyositis.  Past Medical History:  Diagnosis Date  . Anxiety    no meds  . Depression    no meds  . Endometriosis   . History of cervical dysplasia    CIN I  . HSV infection   . Pelvic pain in female   . PONV (postoperative nausea and vomiting)   . SVD (spontaneous vaginal delivery)    x 2  . Thalassemia minor   . Thalassemia trait   . Wears glasses     No Known Allergies  ROS General: Denies fever, chills, night sweats, changes in weight, changes in appetite  +body pain HEENT: Denies ear pain, changes in vision, rhinorrhea, sore throat +HAs CV: Denies CP, palpitations, SOB, orthopnea Pulm: Denies SOB, cough, wheezing GI: Denies abdominal pain, nausea, vomiting, diarrhea, constipation GU: Denies dysuria, hematuria, frequency, vaginal discharge Msk: Denies muscle cramps  + joint pains Neuro: Denies weakness, numbness, tingling Skin: Denies rashes, bruising Psych: Denies depression, hallucinations  +anxiety    Objective:    Blood pressure 102/62, pulse (!) 52, temperature 97.8 F (36.6 C), temperature source Temporal, weight 175  lb (79.4 kg), SpO2 99 %.   Gen. Pleasant, well-nourished, in no distress, normal affect  HEENT: [place]/AT, face symmetric, conjunctiva clear, no scleral icterus, PERRLA, EOMI, nares patent without drainage, pharynx without erythema or exudate. No lymphadenopathy Lungs: no accessory muscle use, CTAB, no wheezes or rales Cardiovascular: RRR, no m/r/g, no peripheral edema Abdomen: BS present, soft, NT/ND, no hepatosplenomegaly. Musculoskeletal: No deformities, no cyanosis or clubbing, normal tone Neuro:  A&Ox3, CN II-XII intact, normal gait.  Patellar,tricep, and  brachioradialis reflexes 2+ b/l.  Decreased sensation to pin prick and soft touch on face, UEs, and LEs.   Skin:  Warm, no lesions/ rash   Wt Readings from Last 3 Encounters:  12/21/19 175 lb (79.4 kg)  11/10/19 175 lb (79.4 kg)  07/13/19 179 lb (81.2 kg)    Lab Results  Component Value Date   WBC 4.6 07/13/2019   HGB 12.1 07/13/2019   HCT 37.8 07/13/2019   PLT 326.0 07/13/2019   GLUCOSE 86 07/13/2019   CHOL 184 07/13/2019   TRIG 82.0 07/13/2019   HDL 48.10 07/13/2019   LDLCALC 119 (H) 07/13/2019   ALT 11 12/29/2012   AST 12 12/29/2012   NA 138 07/13/2019   K 4.7 07/13/2019   CL 103 07/13/2019   CREATININE 0.79 07/13/2019   BUN 11 07/13/2019   CO2 28 07/13/2019   TSH 0.82 07/13/2019  HGBA1C 5.9 07/13/2019    Assessment/Plan:  Myalgia  -discussed various causes including vitamin def. Vs muscle d/o -will obtain labs -mildly decreased sensation to pin prick and soft touch on exam however was not consistently on one side of the body vs the other. -further recs pending labs. -consider f/u with Neurology.  Seen by Dr. Tomi Likens in 2019 - Plan: Vitamin D, 25-hydroxy, Vitamin B12, Folate, CBC (no diff), TSH, T4, free, CK  Frequent headaches -taking zoloft qod may have contributed.  -discussed staying hydrated, limiting caffeine intake, getting plenty of rest -f/u with Neurology for continued symptoms - Plan: Hemoglobin  A1c  Anxiety  -given PHQ 9 and GAD 7 -continue zoloft 50 mg -discussed decreasing dose to 25 mg daily in order to wean.  Pt wishes to wait at this time. - Plan: TSH, T4, free  Multiple joint pain  - Plan: CBC (no diff), Rheumatoid Factor  F/u in 2-4 wks  Grier Mitts, MD

## 2019-12-22 ENCOUNTER — Other Ambulatory Visit: Payer: Self-pay | Admitting: Family Medicine

## 2019-12-22 DIAGNOSIS — E559 Vitamin D deficiency, unspecified: Secondary | ICD-10-CM

## 2019-12-22 LAB — RHEUMATOID FACTOR: Rheumatoid fact SerPl-aCnc: <14 IU/mL (ref <14)

## 2019-12-22 MED ORDER — VITAMIN D (ERGOCALCIFEROL) 1.25 MG (50000 UNIT) PO CAPS: 50000.0000 [IU] | ORAL_CAPSULE | ORAL | 0 refills | Status: DC

## 2019-12-22 MED FILL — VIT D2 1.25 MG (50,000 UNIT: 1.25 MG | 84 days supply | Qty: 12 | Fill #0

## 2019-12-30 MED FILL — SERTRALINE HCL 50 MG TABLET: 50 | 90 days supply | Qty: 90 | Fill #1

## 2020-01-03 MED FILL — VIT D2 1.25 MG (50,000 UNIT: 1.25 MG | 84 days supply | Qty: 12 | Fill #0

## 2020-02-19 ENCOUNTER — Encounter: Payer: Self-pay | Admitting: Family Medicine

## 2020-02-20 ENCOUNTER — Ambulatory Visit: Payer: 59

## 2020-05-01 ENCOUNTER — Encounter: Payer: Self-pay | Admitting: Family Medicine

## 2020-05-01 DIAGNOSIS — F419 Anxiety disorder, unspecified: Secondary | ICD-10-CM

## 2020-05-01 DIAGNOSIS — F32A Depression, unspecified: Secondary | ICD-10-CM

## 2020-05-04 MED ORDER — SERTRALINE HCL 50 MG PO TABS: 50.0000 mg | ORAL_TABLET | Freq: Every day | ORAL | 0 refills | Status: DC

## 2020-05-04 MED FILL — SERTRALINE HCL 50 MG TABS: 50 | 90 days supply | Qty: 90 | Fill #0

## 2020-05-16 ENCOUNTER — Ambulatory Visit (INDEPENDENT_AMBULATORY_CARE_PROVIDER_SITE_OTHER): Payer: 59 | Admitting: *Deleted

## 2020-05-16 ENCOUNTER — Other Ambulatory Visit: Payer: Self-pay

## 2020-05-16 DIAGNOSIS — E538 Deficiency of other specified B group vitamins: Secondary | ICD-10-CM | POA: Diagnosis not present

## 2020-05-16 MED ORDER — CYANOCOBALAMIN 1000 MCG/ML IJ SOLN
1000.0000 ug | Freq: Once | INTRAMUSCULAR | Status: AC
  Administered 2020-05-16: 1000 ug via INTRAMUSCULAR

## 2020-05-16 NOTE — Progress Notes (Signed)
Per orders of Dr. Banks, injection of 1st monthly B 12 given by Alazay Leicht S Jadin Creque. Patient tolerated injection well. 

## 2020-06-05 ENCOUNTER — Other Ambulatory Visit: Payer: Self-pay | Admitting: Obstetrics and Gynecology

## 2020-06-05 DIAGNOSIS — Z1231 Encounter for screening mammogram for malignant neoplasm of breast: Secondary | ICD-10-CM

## 2020-06-12 DIAGNOSIS — Z6834 Body mass index (BMI) 34.0-34.9, adult: Secondary | ICD-10-CM | POA: Diagnosis not present

## 2020-06-12 DIAGNOSIS — Z01419 Encounter for gynecological examination (general) (routine) without abnormal findings: Secondary | ICD-10-CM | POA: Diagnosis not present

## 2020-06-19 ENCOUNTER — Other Ambulatory Visit: Payer: Self-pay

## 2020-06-19 ENCOUNTER — Ambulatory Visit (INDEPENDENT_AMBULATORY_CARE_PROVIDER_SITE_OTHER): Payer: 59 | Admitting: *Deleted

## 2020-06-19 DIAGNOSIS — E538 Deficiency of other specified B group vitamins: Secondary | ICD-10-CM | POA: Diagnosis not present

## 2020-06-19 MED ORDER — CYANOCOBALAMIN 1000 MCG/ML IJ SOLN
1000.0000 ug | Freq: Once | INTRAMUSCULAR | Status: AC
  Administered 2020-06-19: 1000 ug via INTRAMUSCULAR

## 2020-06-19 NOTE — Progress Notes (Signed)
Patient in office for B-12 injection. Injection administered with no immediate reaction.

## 2020-06-29 ENCOUNTER — Other Ambulatory Visit: Payer: Self-pay

## 2020-06-29 ENCOUNTER — Ambulatory Visit
Admission: RE | Admit: 2020-06-29 | Discharge: 2020-06-29 | Disposition: A | Discharge status: Home / Self Care | Payer: 59 | Source: Ambulatory Visit | Attending: Obstetrics and Gynecology | Admitting: Obstetrics and Gynecology

## 2020-06-29 DIAGNOSIS — Z1231 Encounter for screening mammogram for malignant neoplasm of breast: Secondary | ICD-10-CM

## 2020-06-29 LAB — HM MAMMOGRAPHY: HM Mammogram: NORMAL (ref 0–4)

## 2020-07-20 ENCOUNTER — Ambulatory Visit (INDEPENDENT_AMBULATORY_CARE_PROVIDER_SITE_OTHER): Payer: 59 | Admitting: *Deleted

## 2020-07-20 ENCOUNTER — Other Ambulatory Visit: Payer: 59

## 2020-07-20 ENCOUNTER — Other Ambulatory Visit: Payer: Self-pay

## 2020-07-20 DIAGNOSIS — E538 Deficiency of other specified B group vitamins: Secondary | ICD-10-CM

## 2020-07-20 DIAGNOSIS — Z20822 Contact with and (suspected) exposure to covid-19: Secondary | ICD-10-CM | POA: Diagnosis not present

## 2020-07-20 MED ORDER — CYANOCOBALAMIN 1000 MCG/ML IJ SOLN
1000.0000 ug | Freq: Once | INTRAMUSCULAR | Status: AC
  Administered 2020-07-20: 1000 ug via INTRAMUSCULAR

## 2020-07-20 MED ORDER — CYANOCOBALAMIN 1000 MCG/ML IJ SOLN: 1000.0000 ug | Freq: Once | INTRAMUSCULAR | 0 refills | Status: AC

## 2020-07-20 MED FILL — CYANOCOBALAMIN 1,000 MCG/ML: 1000 | 1 days supply | Qty: 1 | Fill #0

## 2020-07-20 NOTE — Progress Notes (Signed)
Per orders of Dr. Hernandez, injection of Cyanocobalamin 1000mcg given by Neala Miggins A. Patient tolerated injection well. 

## 2020-08-15 DIAGNOSIS — Z20822 Contact with and (suspected) exposure to covid-19: Secondary | ICD-10-CM | POA: Diagnosis not present

## 2020-09-03 ENCOUNTER — Other Ambulatory Visit: Payer: Self-pay | Admitting: Family Medicine

## 2020-09-03 ENCOUNTER — Encounter: Payer: Self-pay | Admitting: Family Medicine

## 2020-09-03 DIAGNOSIS — F32A Depression, unspecified: Secondary | ICD-10-CM

## 2020-09-03 DIAGNOSIS — F419 Anxiety disorder, unspecified: Secondary | ICD-10-CM

## 2020-09-06 NOTE — Telephone Encounter (Signed)
Pt is scheduled for a MyChart video visit on 09/07/2020 for her Zoloft refill

## 2020-09-07 ENCOUNTER — Encounter: Payer: Self-pay | Admitting: Family Medicine

## 2020-09-07 ENCOUNTER — Other Ambulatory Visit: Payer: Self-pay | Admitting: Family Medicine

## 2020-09-07 ENCOUNTER — Telehealth: Payer: 59 | Admitting: Family Medicine

## 2020-09-07 DIAGNOSIS — F419 Anxiety disorder, unspecified: Secondary | ICD-10-CM | POA: Diagnosis not present

## 2020-09-07 DIAGNOSIS — F321 Major depressive disorder, single episode, moderate: Secondary | ICD-10-CM

## 2020-09-07 MED ORDER — SERTRALINE HCL 100 MG PO TABS: 100.0000 mg | ORAL_TABLET | Freq: Every day | ORAL | 3 refills | Status: DC

## 2020-09-07 MED FILL — SERTRALINE HCL 100 MG TABS: 100 | 60 days supply | Qty: 60 | Fill #0

## 2020-09-07 NOTE — Progress Notes (Signed)
Virtual Visit via Video Note  I connected with Elizabeth Benson on 09/07/20 at  2:00 PM EDT by a video enabled telemedicine application 2/2 [id number] pandemic and verified that I am speaking with the correct person using two identifiers.  Location patient: home Location provider:work or home office Persons participating in the virtual visit: patient, provider  I discussed the limitations of evaluation and management by telemedicine and the availability of in person appointments. The patient expressed understanding and agreed to proceed.   HPI: Pt is a 39 year old female with past medical history significant for anxiety, depression, endometriosis, HSV, thalassemia minor who was seen for follow-up.  Pt endorses getting agitated easily, wt gain, "sleep is off".  Pt taking zoloft 50 mg daily.  Pt started setting boundaries with her mom.  Pt felt bad telling her mom no.  Pt considering counseling.     Social hx: Pt's mom is 26 and in remission for cancer.  Pt has family in [place], [place].  Pt tries to get her mom to visit family for a few wks.   ROS: See pertinent positives and negatives per HPI.  Past Medical History:  Diagnosis Date  . Anxiety    no meds  . Depression    no meds  . Endometriosis   . History of cervical dysplasia    CIN I  . HSV infection   . Pelvic pain in female   . PONV (postoperative nausea and vomiting)   . SVD (spontaneous vaginal delivery)    x 2  . Thalassemia minor   . Thalassemia trait   . Wears glasses     Past Surgical History:  Procedure Laterality Date  . LAPAROSCOPIC VAGINAL HYSTERECTOMY WITH SALPINGECTOMY Bilateral 10/12/2017   Procedure: LAPAROSCOPy, VAGINAL HYSTERECTOMY WITH BILATERAL  SALPINGECTOMY;  Surgeon: Ena Dawley, MD;  Location: [facility];  Service: Gynecology;  Laterality: Bilateral;  3 Hours  . LAPAROSCOPY  2002   for endometriosis  . LAPAROSCOPY  08/06/2012   Procedure: LAPAROSCOPY OPERATIVE;  Surgeon: Ena Dawley, MD;   Location: [facility];  Service: Gynecology;  Laterality: N/A;  Laparoscopic Pelvic Biopsies  . LAPAROSCOPY N/A 09/04/2015   Procedure: LAPAROSCOPY OPERATIVE with peritoneal biopsies;  Surgeon: Ena Dawley, MD;  [facility];  Service: Gynecology;  Laterality: N/A;  . WISDOM TOOTH EXTRACTION  1990's    Family History  Problem Relation Age of Onset  . Hypertension Mother   . Ovarian cancer Mother   . Diabetes Mother   . Hypertension Maternal Aunt   . Diabetes Maternal Aunt   . Hypertension Maternal Uncle   . Diabetes Maternal Uncle   . Diabetes Paternal Aunt   . Hypertension Paternal Aunt   . Cancer Paternal Aunt        breast  . Diabetes Paternal Uncle   . Hypertension Paternal Uncle   . Stroke Paternal Uncle   . Hypertension Maternal Grandmother   . Cancer Maternal Grandfather        prostate  . Hypertension Maternal Grandfather   . Stroke Maternal Grandfather   . Diabetes Paternal Grandmother   . Stroke Paternal Grandfather   . Hypertension Paternal Grandfather   . Hypertension Father   . High Cholesterol Father      Current Outpatient Medications:  .  sertraline (ZOLOFT) 50 MG tablet, Take 1 tablet (50 mg total) by mouth daily., Disp: 90 tablet, Rfl: 0  EXAM:  VITALS per patient if applicable:  RR between 12-20 bpm  GENERAL: alert, oriented, appears  well and in no acute distress  HEENT: atraumatic, conjunctiva clear, no obvious abnormalities on inspection of external nose and ears  NECK: normal movements of the head and neck  LUNGS: on inspection no signs of respiratory distress, breathing rate appears normal, no obvious gross SOB, gasping or wheezing  CV: no obvious cyanosis  MS: moves all visible extremities without noticeable abnormality  PSYCH/NEURO: pleasant and cooperative, no obvious depression or anxiety, speech and thought processing grossly intact  ASSESSMENT AND PLAN:  Discussed the following assessment and plan:  Anxiety   -GAD-7 score 16 -Discussed counseling options.  Patient to look into EAP. -We will d'c Zoloft 50 mg. -We will start Zoloft 100 mg daily. -Discussed self-care. -Patient encouraged to continue setting boundaries. - Plan: sertraline (ZOLOFT) 100 MG tablet  Depression, major, single episode, moderate (HCC)  -PHQ-9 score 9 -Patient to look into counseling options including EAP. -We will d/c Zoloft 50 mg daily. -We will start Zoloft 100 mg daily. -Discussed self-care. - Plan: sertraline (ZOLOFT) 100 MG tablet  Follow-up in 4-6 weeks   I discussed the assessment and treatment plan with the patient. The patient was provided an opportunity to ask questions and all were answered. The patient agreed with the plan and demonstrated an understanding of the instructions.   The patient was advised to call back or seek an in-person evaluation if the symptoms worsen or if the condition fails to improve as anticipated.   Billie Ruddy, MD

## 2020-09-11 NOTE — Telephone Encounter (Signed)
Pt had an appointment and Rx was sent to pt pharmacy

## 2020-10-12 ENCOUNTER — Other Ambulatory Visit: Payer: Self-pay

## 2020-10-12 ENCOUNTER — Inpatient Hospital Stay: Payer: 59 | Attending: Family Medicine

## 2020-10-12 DIAGNOSIS — Z23 Encounter for immunization: Secondary | ICD-10-CM | POA: Insufficient documentation

## 2020-10-12 NOTE — Progress Notes (Signed)
Pt tolerated injection well, remained for 15 min post injection without any issues.

## 2020-11-20 ENCOUNTER — Other Ambulatory Visit: Payer: 59

## 2020-12-17 ENCOUNTER — Other Ambulatory Visit (HOSPITAL_COMMUNITY): Payer: Self-pay | Admitting: Obstetrics and Gynecology

## 2020-12-17 DIAGNOSIS — N803 Endometriosis of pelvic peritoneum: Secondary | ICD-10-CM | POA: Diagnosis not present

## 2020-12-17 DIAGNOSIS — R102 Pelvic and perineal pain: Secondary | ICD-10-CM | POA: Diagnosis not present

## 2020-12-17 MED FILL — KETOROLAC 10 MG TABLET: 10 | 5 days supply | Qty: 20 | Fill #0

## 2021-01-01 ENCOUNTER — Other Ambulatory Visit: Payer: 59

## 2021-01-02 ENCOUNTER — Encounter: Payer: 59 | Admitting: Family Medicine

## 2021-01-21 DIAGNOSIS — N803 Endometriosis of pelvic peritoneum: Secondary | ICD-10-CM | POA: Diagnosis not present

## 2021-01-21 DIAGNOSIS — R102 Pelvic and perineal pain: Secondary | ICD-10-CM | POA: Diagnosis not present

## 2021-01-21 DIAGNOSIS — N941 Unspecified dyspareunia: Secondary | ICD-10-CM | POA: Diagnosis not present

## 2021-01-21 MED FILL — SERTRALINE HCL 100 MG TABS: 100 | 60 days supply | Qty: 60 | Fill #1

## 2021-02-01 ENCOUNTER — Encounter: Payer: 59 | Admitting: Family Medicine

## 2021-02-19 ENCOUNTER — Other Ambulatory Visit (HOSPITAL_COMMUNITY): Payer: Self-pay

## 2021-02-19 MED ORDER — ORILISSA 200 MG PO TABS
200.0000 mg | ORAL_TABLET | Freq: Two times a day (BID) | ORAL | 6 refills | Status: DC
  Filled 2021-02-19: qty 56, 28d supply, fill #0

## 2021-02-20 ENCOUNTER — Other Ambulatory Visit (HOSPITAL_COMMUNITY): Payer: Self-pay

## 2021-02-21 ENCOUNTER — Other Ambulatory Visit (HOSPITAL_COMMUNITY): Payer: Self-pay

## 2021-05-14 ENCOUNTER — Other Ambulatory Visit (HOSPITAL_COMMUNITY): Payer: Self-pay

## 2021-05-14 MED ORDER — CARESTART COVID-19 HOME TEST VI KIT
PACK | 0 refills | Status: DC
  Filled 2021-05-14: qty 4, 4d supply, fill #0

## 2021-06-17 ENCOUNTER — Other Ambulatory Visit: Payer: Self-pay | Admitting: Obstetrics and Gynecology

## 2021-06-17 DIAGNOSIS — Z1231 Encounter for screening mammogram for malignant neoplasm of breast: Secondary | ICD-10-CM

## 2021-07-18 ENCOUNTER — Ambulatory Visit: Payer: 59

## 2021-08-20 ENCOUNTER — Ambulatory Visit: Payer: 59

## 2021-09-12 ENCOUNTER — Other Ambulatory Visit: Payer: Self-pay | Admitting: Obstetrics and Gynecology

## 2021-09-12 DIAGNOSIS — Z1231 Encounter for screening mammogram for malignant neoplasm of breast: Secondary | ICD-10-CM

## 2021-09-16 ENCOUNTER — Ambulatory Visit: Payer: 59 | Admitting: Family Medicine

## 2021-10-16 ENCOUNTER — Ambulatory Visit
Admission: RE | Admit: 2021-10-16 | Discharge: 2021-10-16 | Disposition: A | Discharge status: Home / Self Care | Payer: 59 | Source: Ambulatory Visit

## 2021-10-16 DIAGNOSIS — Z1231 Encounter for screening mammogram for malignant neoplasm of breast: Secondary | ICD-10-CM | POA: Diagnosis not present

## 2021-11-20 ENCOUNTER — Encounter: Payer: Self-pay | Admitting: Family Medicine

## 2021-11-20 ENCOUNTER — Ambulatory Visit (INDEPENDENT_AMBULATORY_CARE_PROVIDER_SITE_OTHER): Payer: 59 | Admitting: Family Medicine

## 2021-11-20 VITALS — BP 118/84 | HR 104 | Temp 98.4°F | Ht 60.0 in | Wt 180.4 lb

## 2021-11-20 DIAGNOSIS — E6609 Other obesity due to excess calories: Secondary | ICD-10-CM | POA: Diagnosis not present

## 2021-11-20 DIAGNOSIS — R7303 Prediabetes: Secondary | ICD-10-CM

## 2021-11-20 DIAGNOSIS — E7841 Elevated Lipoprotein(a): Secondary | ICD-10-CM

## 2021-11-20 DIAGNOSIS — Z1159 Encounter for screening for other viral diseases: Secondary | ICD-10-CM

## 2021-11-20 DIAGNOSIS — Z6834 Body mass index (BMI) 34.0-34.9, adult: Secondary | ICD-10-CM

## 2021-11-20 DIAGNOSIS — Z0000 Encounter for general adult medical examination without abnormal findings: Secondary | ICD-10-CM | POA: Diagnosis not present

## 2021-11-20 LAB — CBC WITH DIFFERENTIAL/PLATELET
Basophils Absolute: 0 10*3/uL (ref 0.0–0.1)
Basophils Relative: 0.6 % (ref 0.0–3.0)
Eosinophils Absolute: 0.1 10*3/uL (ref 0.0–0.7)
Eosinophils Relative: 2.8 % (ref 0.0–5.0)
HCT: 37.6 % (ref 36.0–46.0)
Hemoglobin: 12 g/dL (ref 12.0–15.0)
Lymphocytes Relative: 39.2 % (ref 12.0–46.0)
Lymphs Abs: 1.7 10*3/uL (ref 0.7–4.0)
MCHC: 31.9 g/dL (ref 30.0–36.0)
MCV: 70.8 fl — ABNORMAL LOW (ref 78.0–100.0)
Monocytes Absolute: 0.3 10*3/uL (ref 0.1–1.0)
Monocytes Relative: 5.9 % (ref 3.0–12.0)
Neutro Abs: 2.3 10*3/uL (ref 1.4–7.7)
Neutrophils Relative %: 51.5 % (ref 43.0–77.0)
Platelets: 302 10*3/uL (ref 150.0–400.0)
RBC: 5.31 Mil/uL — ABNORMAL HIGH (ref 3.87–5.11)
RDW: 16.6 % — ABNORMAL HIGH (ref 11.5–15.5)
WBC: 4.4 10*3/uL (ref 4.0–10.5)

## 2021-11-20 LAB — LIPID PANEL
Cholesterol: 188 mg/dL (ref 0–200)
HDL: 47.4 mg/dL (ref >39.00)
LDL Cholesterol: 127 mg/dL — ABNORMAL HIGH (ref 0–99)
NonHDL: 140.63
Total CHOL/HDL Ratio: 4
Triglycerides: 68 mg/dL (ref 0.0–149.0)
VLDL: 13.6 mg/dL (ref 0.0–40.0)

## 2021-11-20 LAB — COMPREHENSIVE METABOLIC PANEL
ALT: 10 U/L (ref 0–35)
AST: 13 U/L (ref 0–37)
Albumin: 4.4 g/dL (ref 3.5–5.2)
Alkaline Phosphatase: 66 U/L (ref 39–117)
BUN: 9 mg/dL (ref 6–23)
CO2: 29 mEq/L (ref 19–32)
Calcium: 9.6 mg/dL (ref 8.4–10.5)
Chloride: 103 mEq/L (ref 96–112)
Creatinine, Ser: 0.88 mg/dL (ref 0.40–1.20)
GFR: 81.36 mL/min (ref >60.00)
Glucose, Bld: 82 mg/dL (ref 70–99)
Potassium: 4.4 mEq/L (ref 3.5–5.1)
Sodium: 140 mEq/L (ref 135–145)
Total Bilirubin: 0.9 mg/dL (ref 0.2–1.2)
Total Protein: 8.3 g/dL (ref 6.0–8.3)

## 2021-11-20 LAB — VITAMIN D 25 HYDROXY (VIT D DEFICIENCY, FRACTURES): VITD: 25.56 ng/mL — ABNORMAL LOW (ref 30.00–100.00)

## 2021-11-20 LAB — T4, FREE: Free T4: 0.91 ng/dL (ref 0.60–1.60)

## 2021-11-20 LAB — HEMOGLOBIN A1C: Hgb A1c MFr Bld: 5.9 % (ref 4.6–6.5)

## 2021-11-20 LAB — TSH: TSH: 1.11 u[IU]/mL (ref 0.35–5.50)

## 2021-11-20 NOTE — Progress Notes (Signed)
Subjective:     Elizabeth Benson is a 41 y.o. female and is here for a comprehensive physical exam. The patient reports slight decrease in stress as she is setting boundaries with ppl.  Notes weight gain.  Working for [employer] from home, then working part time at [employer].  Pt reads in her time off.  Inquires about wt loss meds.  Had a flu shot in Oct 2022.  Followed by [name], PA-C.  Social History   Socioeconomic History   Marital status: Single    Spouse name: Not on file   Number of children: 2   Years of education: Not on file   Highest education level: Bachelor's degree (e.g., BA, AB, BS)  Occupational History   Occupation: Counsellor: [employer]   Tobacco Use   Smoking status: Former    Packs/day: 0.25    Years: 8.00    Pack years: 2.00    Types: Cigarettes    Quit date: 08/09/2011    Years since quitting: 10.2   Smokeless tobacco: Never  Vaping Use   Vaping Use: Never used  Substance and Sexual Activity   Alcohol use: Yes    Comment: occasional    Drug use: No   Sexual activity: Not Currently    Birth control/protection: None  Other Topics Concern   Not on file  Social History Narrative   Patient is right-handed. She lives in a  One level home with her 2 children and her mother. She drinks 2 cups of coffee in the morning and 2 cups of decaf coffee in the afternoons. She drinks 32 oz of tea a day, 5 days a week. She walks her dog daily.   Social Determinants of Health   Financial Resource Strain: Not on file  Food Insecurity: Not on file  Transportation Needs: Not on file  Physical Activity: Not on file  Stress: Not on file  Social Connections: Not on file  Intimate Partner Violence: Not on file   Health Maintenance  Topic Date Due   Pneumococcal Vaccine 19-33 Years old (1 - PCV) Never done   HIV Screening  Never done   Hepatitis C Screening  Never done   PAP SMEAR-Modifier  07/31/2020    COVID-19 Vaccine (5 - Booster for Pfizer series) 12/07/2020   TETANUS/TDAP  08/01/2027   INFLUENZA VACCINE  Completed   HPV VACCINES  Aged Out    The following portions of the patient's history were reviewed and updated as appropriate: allergies, current medications, past family history, past medical history, past social history, past surgical history, and problem list.  Review of Systems Pertinent items noted in HPI and remainder of comprehensive ROS otherwise negative.   Objective:    BP 118/84 (BP Location: Left Arm, Patient Position: Sitting, Cuff Size: Normal)    Pulse (!) 104    Temp 98.4 F (36.9 C) (Oral)    Ht 5' (1.524 m)    Wt 180 lb 6.4 oz (81.8 kg)    LMP  (LMP Unknown)    SpO2 99%    BMI 35.23 kg/m  General appearance: alert, cooperative, and no distress Head: Normocephalic, without obvious abnormality, atraumatic Eyes: conjunctivae/corneas clear. PERRL, EOM's intact. Fundi benign. Ears: normal TM's and external ear canals both ears Nose: Nares normal. Septum midline. Mucosa normal. No drainage or sinus tenderness. Throat: lips, mucosa, and tongue normal; teeth and gums normal Neck: no adenopathy, no  carotid bruit, no JVD, supple, symmetrical, trachea midline, and thyroid not enlarged, symmetric, no tenderness/mass/nodules Lungs: clear to auscultation bilaterally Heart: regular rate and rhythm, S1, S2 normal, no murmur, click, rub or gallop Abdomen: soft, non-tender; bowel sounds normal; no masses,  no organomegaly Extremities: extremities normal, atraumatic, no cyanosis or edema Pulses: 2+ and symmetric Skin: Skin color, texture, turgor normal. No rashes or lesions Lymph nodes: Cervical, supraclavicular, and axillary nodes normal. Neurologic: Alert and oriented X 3, normal strength and tone. Normal symmetric reflexes. Normal coordination and gait    Assessment:    Healthy female exam.      Plan:    Anticipatory guidance given including wearing seatbelts, smoke  detectors in the home, increasing physical activity, increasing p.o. intake of water and vegetables. -labs -pap up to date.  Done with OB/Gyn -mammogram done 10/16/21 -given handout -next CPE 1 year See After Visit Summary for Counseling Recommendations   Class 1 obesity due to excess calories without serious comorbidity with body mass index (BMI) of 34.0 to 34.9 in adult  -lifestyle modifications -pt to check with insurance company regarding preferred wt loss medications - Plan: TSH, T4, Free, Hemoglobin A1c, Lipid panel, Vitamin D, 25-hydroxy  Encounter for hepatitis C screening test for low risk patient  - Plan: Hep C Antibody  Elevated lipoprotein(a)  -LD 119 on 07/13/2019  - Plan: Lipid panel  Prediabetes -Hemoglobin A1c 6.4% on 12/21/2019 -Lifestyle modifications -Plan: Hemoglobin A1c  Follow-up in 1 month.  Grier Mitts, MD

## 2021-11-21 LAB — HEPATITIS C ANTIBODY
Hepatitis C Ab: NONREACTIVE
SIGNAL TO CUT-OFF: 0.13 (ref <1.00)

## 2021-11-25 ENCOUNTER — Other Ambulatory Visit: Payer: Self-pay | Admitting: Family Medicine

## 2021-11-25 ENCOUNTER — Other Ambulatory Visit (HOSPITAL_COMMUNITY): Payer: Self-pay

## 2021-11-25 DIAGNOSIS — E559 Vitamin D deficiency, unspecified: Secondary | ICD-10-CM | POA: Insufficient documentation

## 2021-11-25 MED ORDER — VITAMIN D (ERGOCALCIFEROL) 1.25 MG (50000 UNIT) PO CAPS
50000.0000 [IU] | ORAL_CAPSULE | ORAL | 0 refills | Status: DC
  Filled 2021-11-25 – 2022-01-06 (×2): qty 12, 84d supply, fill #0

## 2021-11-26 ENCOUNTER — Other Ambulatory Visit (HOSPITAL_COMMUNITY): Payer: Self-pay

## 2021-12-04 ENCOUNTER — Other Ambulatory Visit (HOSPITAL_COMMUNITY): Payer: Self-pay

## 2021-12-09 ENCOUNTER — Encounter: Payer: Self-pay | Admitting: Family Medicine

## 2021-12-10 NOTE — Telephone Encounter (Signed)
I have submitted the PA, I don't see where the Rx was sent in?

## 2021-12-23 ENCOUNTER — Ambulatory Visit: Payer: 59 | Admitting: Family Medicine

## 2021-12-24 ENCOUNTER — Telehealth (INDEPENDENT_AMBULATORY_CARE_PROVIDER_SITE_OTHER): Payer: 59 | Admitting: Family Medicine

## 2021-12-24 ENCOUNTER — Encounter: Payer: Self-pay | Admitting: Family Medicine

## 2021-12-24 ENCOUNTER — Other Ambulatory Visit: Payer: Self-pay

## 2021-12-24 ENCOUNTER — Other Ambulatory Visit (HOSPITAL_COMMUNITY): Payer: Self-pay

## 2021-12-24 VITALS — Temp 100.4°F | Ht 60.0 in | Wt 180.4 lb

## 2021-12-24 DIAGNOSIS — R059 Cough, unspecified: Secondary | ICD-10-CM | POA: Diagnosis not present

## 2021-12-24 DIAGNOSIS — R6889 Other general symptoms and signs: Secondary | ICD-10-CM | POA: Diagnosis not present

## 2021-12-24 DIAGNOSIS — R0981 Nasal congestion: Secondary | ICD-10-CM

## 2021-12-24 MED ORDER — BENZONATATE 100 MG PO CAPS
ORAL_CAPSULE | ORAL | 0 refills | Status: DC
  Filled 2021-12-24: qty 30, 15d supply, fill #0

## 2021-12-24 NOTE — Progress Notes (Signed)
Virtual Visit via Video Note  I connected with Elizabeth Benson  on 12/24/21 at 12:00 PM EST by a video enabled telemedicine application and verified that I am speaking with the correct person using two identifiers.  Location patient: [place] Location provider:work or home office Persons participating in the virtual visit: patient, provider  I discussed the limitations and requested verbal permission for telemedicine visit. The patient expressed understanding and agreed to proceed.   HPI:  Acute telemedicine visit for cough: -Onset: 2 days ago -Symptoms include:body aches, fever, cough, nasal congestion -Denies: NVD, CP, SOB, inability to tol oral intake/get out of bed -daughter has been sick -covid test was negative yesterday -Pertinent past medical history: see below -denies any chance of pregnancy -Pertinent medication allergies:No Known Allergies -COVID-19 vaccine status:  Immunization History  Administered Date(s) Administered   Influenza,inj,Quad PF,6+ Mos 07/13/2019   Influenza-Unspecified 07/08/2018, 08/09/2020, 08/17/2021   PFIZER(Purple Top)SARS-COV-2 Vaccination 12/05/2019, 01/02/2020, 10/12/2020     ROS: See pertinent positives and negatives per HPI.  Past Medical History:  Diagnosis Date   Anxiety    no meds   Depression    no meds   Endometriosis    History of cervical dysplasia    CIN I   HSV infection    Pelvic pain in female    PONV (postoperative nausea and vomiting)    SVD (spontaneous vaginal delivery)    x 2   Thalassemia minor    Thalassemia trait    Wears glasses     Past Surgical History:  Procedure Laterality Date   LAPAROSCOPIC VAGINAL HYSTERECTOMY WITH SALPINGECTOMY Bilateral 10/12/2017   Procedure: LAPAROSCOPy, VAGINAL HYSTERECTOMY WITH BILATERAL  SALPINGECTOMY;  Surgeon: Ena Dawley, MD;  Location: [facility];  Service: Gynecology;  Laterality: Bilateral;  3 Hours   LAPAROSCOPY  2002   for endometriosis   LAPAROSCOPY  08/06/2012   Procedure:  LAPAROSCOPY OPERATIVE;  Surgeon: Ena Dawley, MD;  Location: [facility];  Service: Gynecology;  Laterality: N/A;  Laparoscopic Pelvic Biopsies   LAPAROSCOPY N/A 09/04/2015   Procedure: LAPAROSCOPY OPERATIVE with peritoneal biopsies;  Surgeon: Ena Dawley, MD;  Location: [facility];  Service: Gynecology;  Laterality: N/A;   WISDOM TOOTH EXTRACTION  1990's     Current Outpatient Medications:    benzonatate (TESSALON PERLES) 100 MG capsule, 1-2 capsules up to twice daily as needed for cough, Disp: 30 capsule, Rfl: 0   COVID-19 At Home Antigen Test (CARESTART COVID-19 HOME TEST) KIT, Use as directed, Disp: 4 each, Rfl: 0   Elagolix Sodium (ORILISSA) 200 MG TABS, Take 1 tablet (200 mg) by mouth 2 (two) times daily., Disp: 56 tablet, Rfl: 6   Vitamin D, Ergocalciferol, (DRISDOL) 1.25 MG (50000 UNIT) CAPS capsule, Take 1 capsule (50,000 Units total) by mouth every 7 (seven) days., Disp: 12 capsule, Rfl: 0   sertraline (ZOLOFT) 100 MG tablet, TAKE 1 TABLET BY MOUTH ONCE A DAY, Disp: 60 tablet, Rfl: 3  EXAM:  VITALS per patient if applicable:  GENERAL: alert, oriented, appears well and in no acute distress  HEENT: atraumatic, conjunttiva clear, no obvious abnormalities on inspection of external nose and ears  NECK: normal movements of the head and neck  LUNGS: on inspection no signs of respiratory distress, breathing rate appears normal, no obvious gross SOB, gasping or wheezing  CV: no obvious cyanosis  MS: moves all visible extremities without noticeable abnormality  PSYCH/NEURO: pleasant and cooperative, no obvious depression or anxiety, speech and thought processing grossly intact  ASSESSMENT AND PLAN:  Discussed the following assessment and plan:  Cough, unspecified type  Nasal congestion  Flu-like symptoms  -we discussed possible serious and likely etiologies, options for evaluation and workup, limitations of telemedicine visit vs in person visit, treatment,  treatment risks and precautions. Pt is agreeable to treatment via telemedicine at this moment. Query covid with false neg testing, influenza, other viral illness vs other. She has opted to retest for covid q 48 hours, nasal saline rinses, Tessalon rx for cough and other symptomatic care measures per patient instructions.  Work/School slipped offered: declined Advised to seek prompt virtual visit or in person care if worsening, new symptoms arise, or if is not improving with treatment as expected per our conversation of expected course. Discussed options for follow up care. Did let this patient know that I do telemedicine on Tuesdays and Thursdays for [facility] and those are the days I am logged into the system. Advised to schedule follow up visit with PCP, [facility] virtual visits or [facility] if any further questions or concerns to avoid delays in care.   I discussed the assessment and treatment plan with the patient. The patient was provided an opportunity to ask questions and all were answered. The patient agreed with the plan and demonstrated an understanding of the instructions.     Lucretia Kern, DO

## 2021-12-24 NOTE — Patient Instructions (Addendum)
°  HOME CARE TIPS:  -COVID19 testing information: [email or web address].[email or web address]  Most pharmacies also offer testing and home test kits. If the Covid19 test is positive and you desire antiviral treatment, please contact a [facility] or schedule a follow up virtual visit through your primary care office or through the [facility].  Other test to treat options: [email or web address].[email or web address]  -I sent the medication(s) we discussed to your pharmacy: Meds ordered this encounter  Medications   benzonatate (TESSALON PERLES) 100 MG capsule    Sig: 1-2 capsules up to twice daily as needed for cough    Dispense:  30 capsule    Refill:  0     -can use tylenol or aleve if needed for fevers, aches and pains per instructions  -can use nasal saline a few times per day if you have nasal congestion  -stay hydrated, drink plenty of fluids and eat small healthy meals - avoid dairy  -can take 1000 IU (38mcg) Vit D3 and 100-500 mg of Vit C daily per instructions  -If the Covid test is positive, check out the CDC website for more information on home care, transmission and treatment for COVID19  -follow up with your doctor in 2-3 days unless improving and feeling better  -stay home while sick, except to seek medical care. If you have COVID19, you will likely be contagious for 7-10 days. Flu or Influenza is likely contagious for about 7 days. Other respiratory viral infections remain contagious for 5-10+ days depending on the virus and many other factors. Wear a good mask that fits snugly (such as N95 or KN95) if around others to reduce the risk of transmission.  It was nice to meet you today, and I really hope you are feeling better soon. I help [facility] out with telemedicine visits on Tuesdays and Thursdays and am happy to help if you need a follow up virtual visit on those days. Otherwise, if you have any concerns or questions following this visit  please schedule a follow up visit with your Primary Care doctor or seek care at a local urgent care clinic to avoid delays in care.    Seek in person care or schedule a follow up video visit promptly if your symptoms worsen, new concerns arise or you are not improving with treatment. Call 911 and/or seek emergency care if your symptoms are severe or life threatening.

## 2021-12-26 NOTE — Telephone Encounter (Signed)
Another form was sent back from the company requesting additional information regarding previous medications tried and current modifications being made.

## 2022-01-01 ENCOUNTER — Ambulatory Visit: Payer: 59 | Admitting: Family Medicine

## 2022-01-01 ENCOUNTER — Encounter: Payer: Self-pay | Admitting: Family Medicine

## 2022-01-01 ENCOUNTER — Other Ambulatory Visit (HOSPITAL_COMMUNITY): Payer: Self-pay

## 2022-01-01 VITALS — BP 135/77 | HR 64 | Temp 98.3°F | Wt 184.6 lb

## 2022-01-01 DIAGNOSIS — E559 Vitamin D deficiency, unspecified: Secondary | ICD-10-CM

## 2022-01-01 DIAGNOSIS — E6609 Other obesity due to excess calories: Secondary | ICD-10-CM | POA: Diagnosis not present

## 2022-01-01 DIAGNOSIS — Z7689 Persons encountering health services in other specified circumstances: Secondary | ICD-10-CM | POA: Diagnosis not present

## 2022-01-01 DIAGNOSIS — Z6836 Body mass index (BMI) 36.0-36.9, adult: Secondary | ICD-10-CM

## 2022-01-01 MED ORDER — SAXENDA 18 MG/3ML ~~LOC~~ SOPN
PEN_INJECTOR | SUBCUTANEOUS | 2 refills | Status: DC
  Filled 2022-01-01: qty 15, 30d supply, fill #0
  Filled 2022-02-18 – 2022-03-26 (×2): qty 15, 30d supply, fill #1

## 2022-01-01 NOTE — Progress Notes (Signed)
Subjective:  ? ? Patient ID: Elizabeth Benson, female    DOB: 03-15-1981, 41 y.o.   MRN: [id number] ? ?Chief Complaint  ?Patient presents with  ? Follow-up  ?  A1c. Ins staed they will cover Saxenda  ? ? ?HPI ?Patient was seen today for f/u.  At last visit pt expressed interest in weight loss medications.  Found out insurance company will cover [place].  PA sent to office, requested additional information.  In the past patient tried about Alli.  Tried weight watchers but felt like she was hungry throughout the day.  Had a Physiological scientist.  Was able to get under 170 lbs.  Notes diet was restricted by trainer to several meals per day of lean protein such as chicken breast and brown rice.  Pt plans to join the [employer] program/gym with [facility].  Patient denies personal or family history of medullary thyroid cancer, pancreatitis, allergic reactions. ? ?Past Medical History:  ?Diagnosis Date  ? Anxiety   ? no meds  ? Depression   ? no meds  ? Endometriosis   ? History of cervical dysplasia   ? CIN I  ? HSV infection   ? Pelvic pain in female   ? PONV (postoperative nausea and vomiting)   ? SVD (spontaneous vaginal delivery)   ? x 2  ? Thalassemia minor   ? Thalassemia trait   ? Wears glasses   ? ? ?No Known Allergies ? ?ROS ?General: Denies fever, chills, night sweats, changes in weight, changes in appetite  + weight gain ?HEENT: Denies headaches, ear pain, changes in vision, rhinorrhea, sore throat ?CV: Denies CP, palpitations, SOB, orthopnea ?Pulm: Denies SOB, cough, wheezing ?GI: Denies abdominal pain, nausea, vomiting, diarrhea, constipation ?GU: Denies dysuria, hematuria, frequency, vaginal discharge ?Msk: Denies muscle cramps, joint pains ?Neuro: Denies weakness, numbness, tingling ?Skin: Denies rashes, bruising ?Psych: Denies depression, anxiety, hallucinations ? ?   ?Objective:  ?  ?Blood pressure 135/77, pulse 64, temperature 98.3 ?F (36.8 ?C), temperature source Oral, weight 184 lb 9.6 oz  (83.7 kg), SpO2 100 %. Body mass index is 36.05 kg/m?. ? ?Gen. Pleasant, well-nourished, in no distress, normal affect   ?HEENT: [place]/AT, face symmetric, conjunctiva clear, no scleral icterus, PERRLA, EOMI, nares patent without drainage ?Lungs: no accessory muscle use, CTAB, no wheezes or rales ?Cardiovascular: RRR, no m/r/g, no peripheral edema ?Musculoskeletal: No deformities, no cyanosis or clubbing, normal tone ?Neuro:  A&Ox3, CN II-XII intact, normal gait ?Skin:  Warm, no lesions/ rash ? ? ?Wt Readings from Last 3 Encounters:  ?12/24/21 180 lb 6.4 oz (81.8 kg)  ?11/20/21 180 lb 6.4 oz (81.8 kg)  ?12/21/19 175 lb (79.4 kg)  ? ? ?Lab Results  ?Component Value Date  ? WBC 4.4 11/20/2021  ? HGB 12.0 11/20/2021  ? HCT 37.6 11/20/2021  ? PLT 302.0 11/20/2021  ? GLUCOSE 82 11/20/2021  ? CHOL 188 11/20/2021  ? TRIG 68.0 11/20/2021  ? HDL 47.40 11/20/2021  ? LDLCALC 127 (H) 11/20/2021  ? ALT 10 11/20/2021  ? AST 13 11/20/2021  ? NA 140 11/20/2021  ? K 4.4 11/20/2021  ? CL 103 11/20/2021  ? CREATININE 0.88 11/20/2021  ? BUN 9 11/20/2021  ? CO2 29 11/20/2021  ? TSH 1.11 11/20/2021  ? HGBA1C 5.9 11/20/2021  ? ? ?Assessment/Plan: ? ?Encounter for weight management ? ?Class 2 obesity due to excess calories without serious comorbidity with body mass index (BMI) of 36.0 to 36.9 in adult ?-Body mass index is  36.05 kg/m?. ?- Plan: Liraglutide -Weight Management (SAXENDA) 18 MG/3ML SOPN ? ?Vitamin D deficiency ?-Vitamin D 25.56 on 11/20/2021 ?-Continue ergocalciferol 50,000 IU weekly x12 weeks ? ?Weight today 184 lbs.  Was 180 lbs on 12/24/2021.  Patient tried various weight loss methods including weight watchers, Alli, exercise trainer some success at weight loss but has regained weight.  PA for Saxenda completed.  Will fax form to insurance company.  Discussed r/b/a.  Rx for Saxenda sent to pharmacy.  Reviewed use instructions.  Patient encouraged to continue diet and exercise. ? ?F/u 4-6 wks ? ?Grier Mitts, MD ?

## 2022-01-01 NOTE — Patient Instructions (Addendum)
A prescription for Saxenda was sent to your pharmacy.  The prior authorization for this medication will be sent in today to your insurance company.  When you start the medication you are to inject 0.6 mg daily x1 week.  After that week we will increase the dose by 0.6 mg increments (0.6mg   to 1.2 mg  to 1.8 mg to 2.4 mg to 3 mg) until you reach a total dose of 3 mg.  Once at a dose of 3 mg daily continue that dose each day.  Continue working on diet and exercise changes. ?

## 2022-01-03 NOTE — Telephone Encounter (Signed)
PA forms faxed in on 01/03/22, confirmation rec'd. ?

## 2022-01-07 ENCOUNTER — Other Ambulatory Visit (HOSPITAL_COMMUNITY): Payer: Self-pay

## 2022-01-08 ENCOUNTER — Other Ambulatory Visit (HOSPITAL_COMMUNITY): Payer: Self-pay

## 2022-01-31 DIAGNOSIS — N803 Endometriosis of pelvic peritoneum, unspecified: Secondary | ICD-10-CM | POA: Diagnosis not present

## 2022-01-31 DIAGNOSIS — Z90711 Acquired absence of uterus with remaining cervical stump: Secondary | ICD-10-CM | POA: Diagnosis not present

## 2022-01-31 DIAGNOSIS — R102 Pelvic and perineal pain: Secondary | ICD-10-CM | POA: Diagnosis not present

## 2022-01-31 DIAGNOSIS — Z1231 Encounter for screening mammogram for malignant neoplasm of breast: Secondary | ICD-10-CM | POA: Diagnosis not present

## 2022-01-31 DIAGNOSIS — Z01419 Encounter for gynecological examination (general) (routine) without abnormal findings: Secondary | ICD-10-CM | POA: Diagnosis not present

## 2022-02-07 IMAGING — MG DIGITAL SCREENING BILAT W/ TOMO W/ CAD
8 series · 8 of 24 positions shown · non-contrast
Comparison: Previous exam(s).

CLINICAL DATA: Screening.

EXAM:
DIGITAL SCREENING BILATERAL MAMMOGRAM WITH TOMO AND CAD

[R MLO synth-2D]
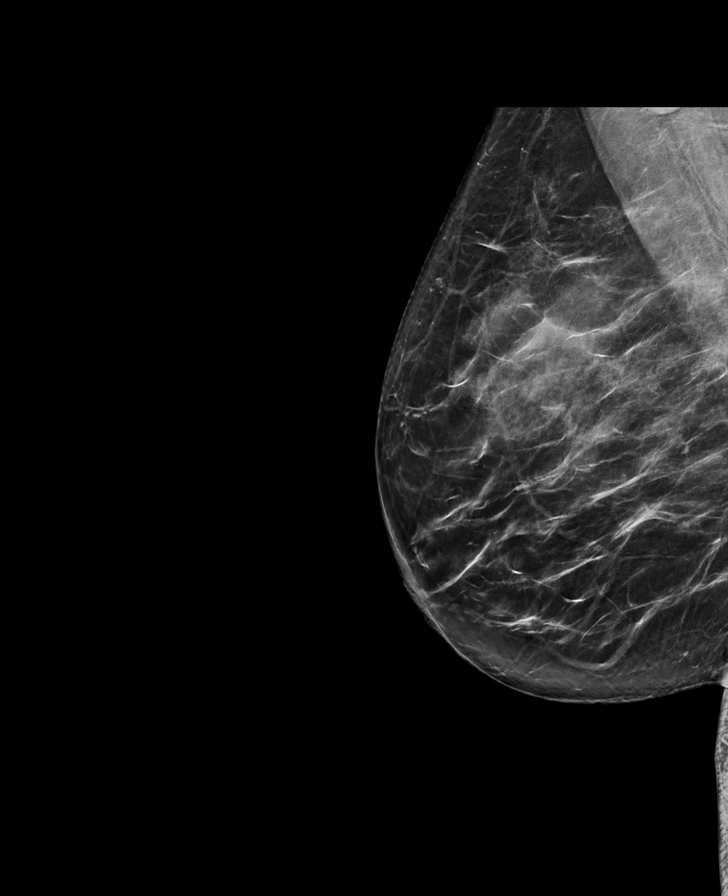

[R CC synth-2D]
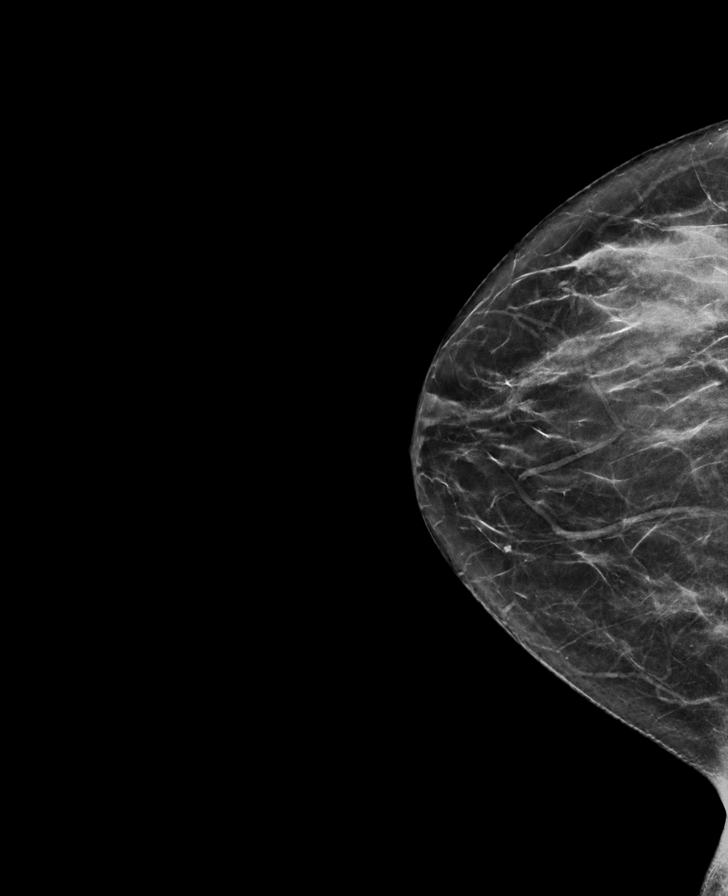

[L CC synth-2D]
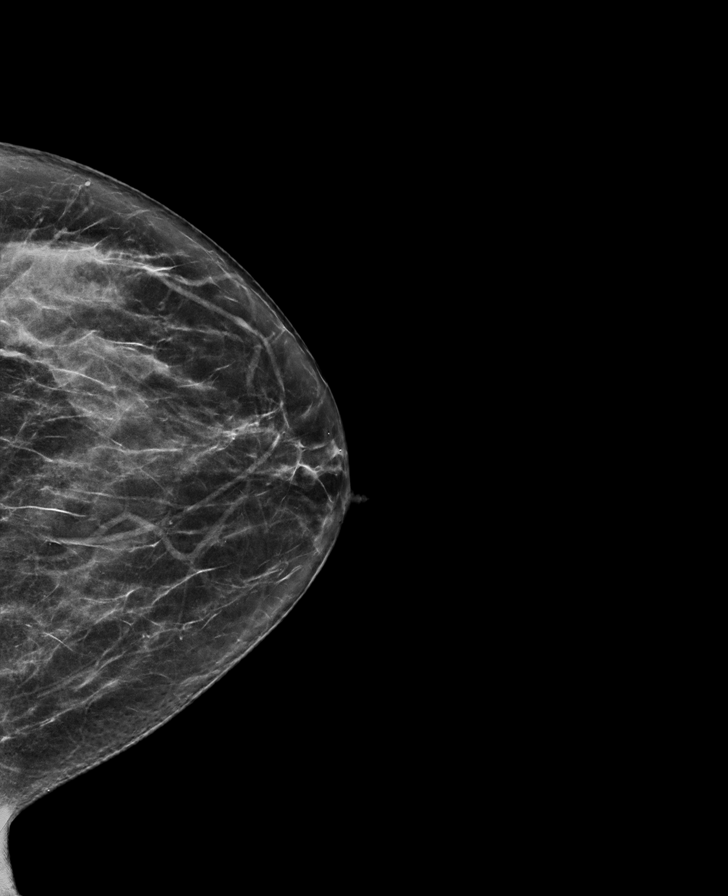

[L MLO synth-2D]
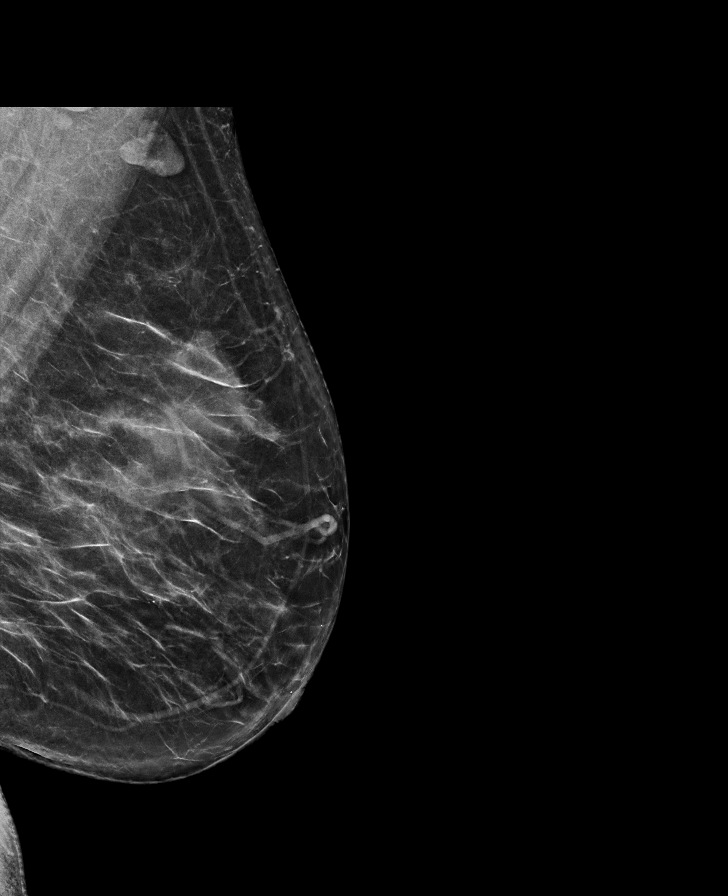

[R MLO tomo · tomo slice 39/77.0]
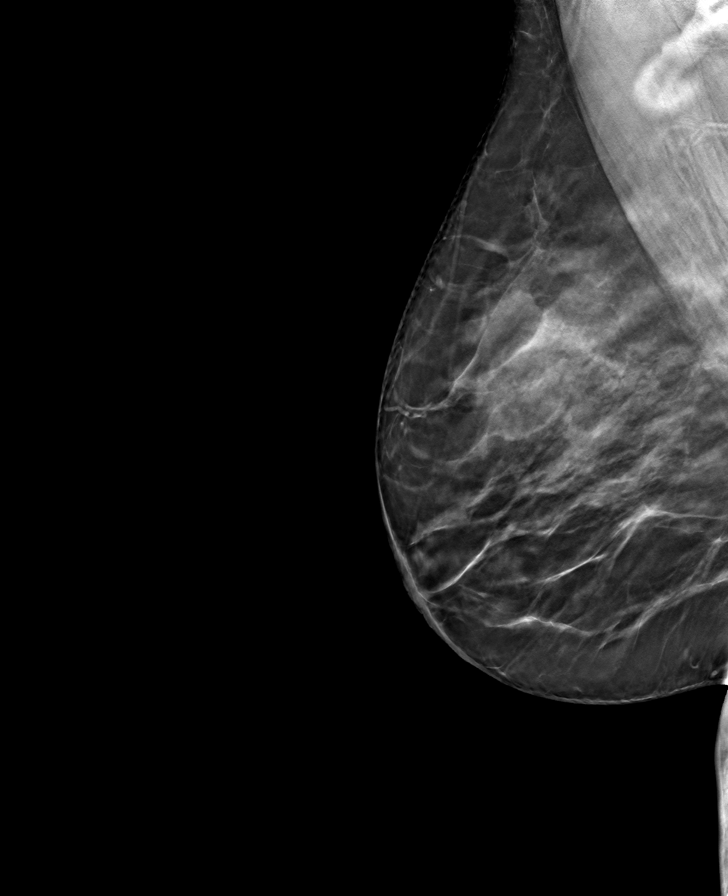

[R CC tomo · tomo slice 34/67.0]
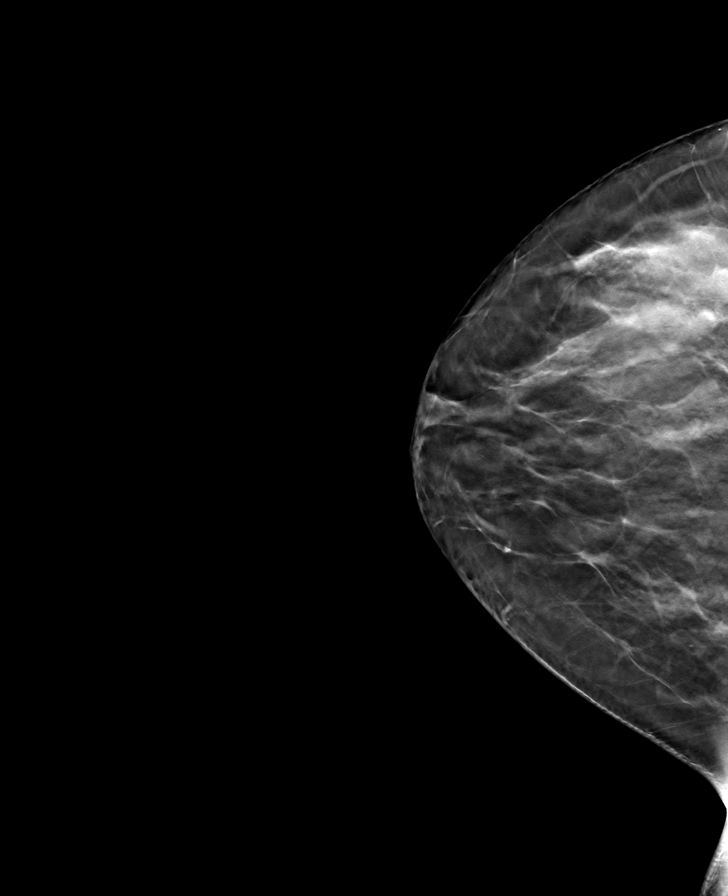

[L CC tomo · tomo slice 34/67.0]
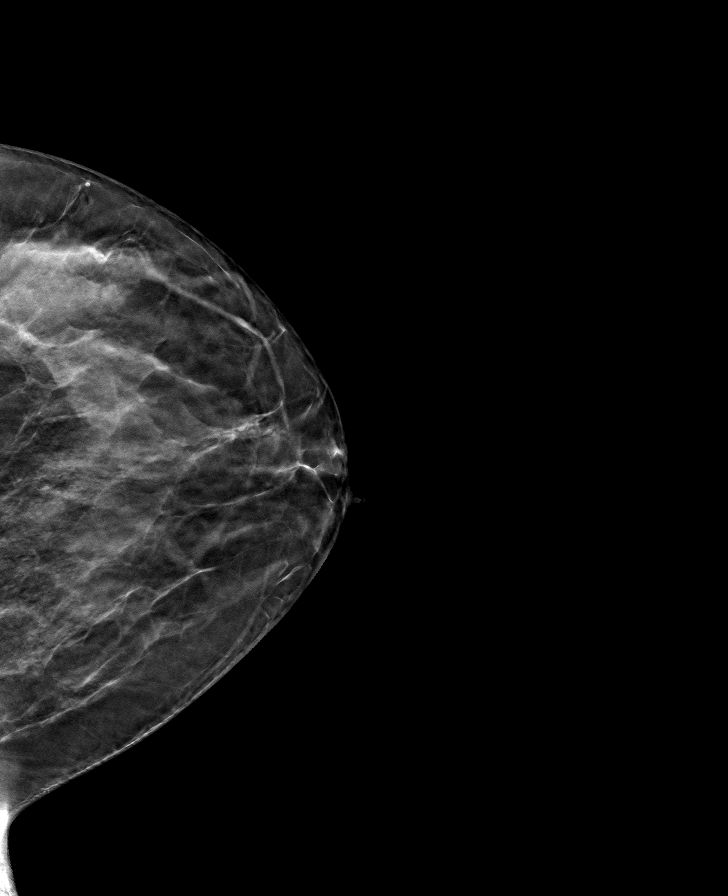

[L MLO tomo · tomo slice 39/78.0]
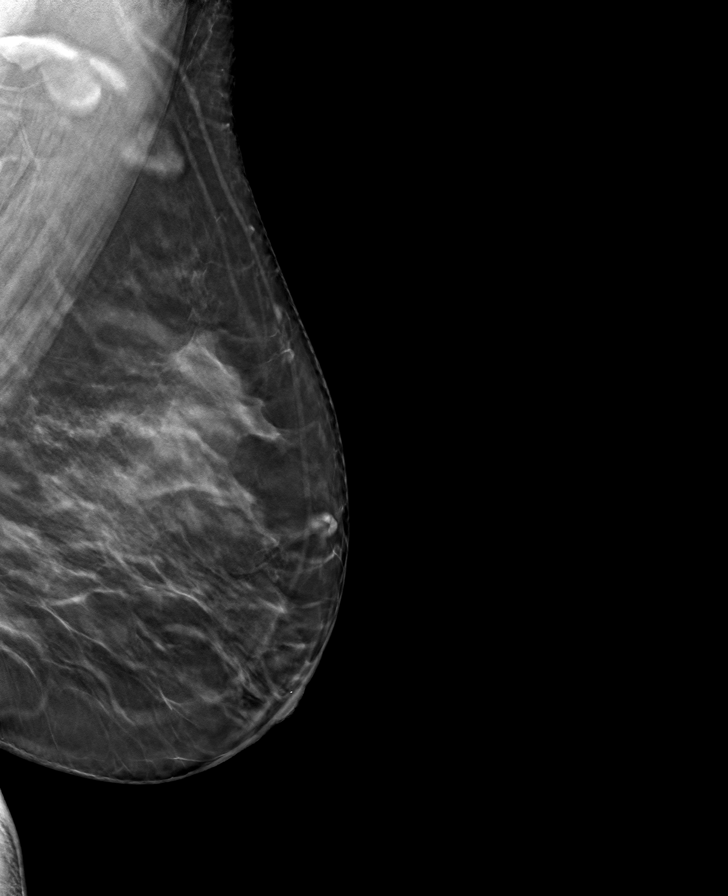

[8 of 24 positions shown; findings below may reference images not displayed]

ACR Breast Density Category c: The breast tissue is heterogeneously
dense, which may obscure small masses.
FINDINGS: There are no findings suspicious for malignancy. Images were
processed with CAD.
IMPRESSION: No mammographic evidence of malignancy. A result letter of this
screening mammogram will be mailed directly to the patient.

RECOMMENDATION:
Screening mammogram in one year. (Code:FT-U-LHB)

BI-RADS CATEGORY  1: Negative.

## 2022-02-18 ENCOUNTER — Other Ambulatory Visit (HOSPITAL_COMMUNITY): Payer: Self-pay

## 2022-02-19 ENCOUNTER — Ambulatory Visit: Payer: 59 | Admitting: Family Medicine

## 2022-02-26 ENCOUNTER — Other Ambulatory Visit (HOSPITAL_COMMUNITY): Payer: Self-pay

## 2022-03-21 ENCOUNTER — Ambulatory Visit: Payer: 59 | Admitting: Family Medicine

## 2022-03-26 ENCOUNTER — Other Ambulatory Visit (HOSPITAL_COMMUNITY): Payer: Self-pay

## 2022-04-25 ENCOUNTER — Ambulatory Visit: Payer: 59 | Admitting: Family Medicine

## 2022-05-09 ENCOUNTER — Ambulatory Visit: Payer: 59 | Admitting: Family Medicine

## 2022-05-09 ENCOUNTER — Other Ambulatory Visit (HOSPITAL_COMMUNITY): Payer: Self-pay

## 2022-05-09 VITALS — BP 104/70 | HR 72 | Temp 98.5°F | Wt 177.2 lb

## 2022-05-09 DIAGNOSIS — E6609 Other obesity due to excess calories: Secondary | ICD-10-CM

## 2022-05-09 DIAGNOSIS — Z7689 Persons encountering health services in other specified circumstances: Secondary | ICD-10-CM | POA: Diagnosis not present

## 2022-05-09 DIAGNOSIS — Z6834 Body mass index (BMI) 34.0-34.9, adult: Secondary | ICD-10-CM | POA: Diagnosis not present

## 2022-05-09 MED ORDER — SAXENDA 18 MG/3ML ~~LOC~~ SOPN
3.0000 mg | PEN_INJECTOR | Freq: Every day | SUBCUTANEOUS | 11 refills | Status: DC
Start: 2022-05-09 — End: 2022-11-28
  Filled 2022-05-09 – 2022-08-12 (×2): qty 15, 30d supply, fill #0
  Filled 2022-09-07: qty 15, 30d supply, fill #1
  Filled 2022-10-18: qty 15, 30d supply, fill #2
  Filled 2022-11-18: qty 15, 30d supply, fill #3

## 2022-05-09 NOTE — Progress Notes (Signed)
Subjective:    Patient ID: Elizabeth Benson, female    DOB: [birth year], 41 y.o.   MRN: [id number]  Chief Complaint  Patient presents with   Follow-up    On weight loss and for Saxenda    HPI Patient was seen today for weight management.  Patient currently on Saxenda 3 mg daily.  States medication was helping his sugar cravings.  Patient lost weight but regained it while on several vacations as she could not take the medication with her as she would not have been able to keep it refrigerated.  Patient went to [place], [place], [place], and [place] over the last few months.  Patient also endorses eating well during her son's graduation party.  Pt states she will be able to be more consistent with taking the medication now that she is done with travel.    Past Medical History:  Diagnosis Date   Anxiety    no meds   Depression    no meds   Endometriosis    History of cervical dysplasia    CIN I   HSV infection    Pelvic pain in female    PONV (postoperative nausea and vomiting)    SVD (spontaneous vaginal delivery)    x 2   Thalassemia minor    Thalassemia trait    Wears glasses     No Known Allergies  ROS General: Denies fever, chills, night sweats, +changes in weight, changes in appetite HEENT: Denies headaches, ear pain, changes in vision, rhinorrhea, sore throat CV: Denies CP, palpitations, SOB, orthopnea Pulm: Denies SOB, cough, wheezing GI: Denies abdominal pain, nausea, vomiting, diarrhea, constipation GU: Denies dysuria, hematuria, frequency, vaginal discharge Msk: Denies muscle cramps, joint pains Neuro: Denies weakness, numbness, tingling Skin: Denies rashes, bruising Psych: Denies depression, anxiety, hallucinations    Objective:    Blood pressure 104/70, pulse 72, temperature 98.5 F (36.9 C), temperature source Oral, weight 177 lb 3.2 oz (80.4 kg), SpO2 98 %. Body mass index is 34.61 kg/m.  Gen. Pleasant, well-nourished, in no distress,  normal affect   HEENT: [place]/AT, face symmetric, conjunctiva clear, no scleral icterus, PERRLA, EOMI, nares patent without drainage Lungs: no accessory muscle use Cardiovascular: RRR, no peripheral edema Neuro:  A&Ox3, CN II-XII intact, normal gait Skin:  Warm, no lesions/ rash   Wt Readings from Last 3 Encounters:  05/09/22 177 lb 3.2 oz (80.4 kg)  01/01/22 184 lb 9.6 oz (83.7 kg)  12/24/21 180 lb 6.4 oz (81.8 kg)    Lab Results  Component Value Date   WBC 4.4 11/20/2021   HGB 12.0 11/20/2021   HCT 37.6 11/20/2021   PLT 302.0 11/20/2021   GLUCOSE 82 11/20/2021   CHOL 188 11/20/2021   TRIG 68.0 11/20/2021   HDL 47.40 11/20/2021   LDLCALC 127 (H) 11/20/2021   ALT 10 11/20/2021   AST 13 11/20/2021   NA 140 11/20/2021   K 4.4 11/20/2021   CL 103 11/20/2021   CREATININE 0.88 11/20/2021   BUN 9 11/20/2021   CO2 29 11/20/2021   TSH 1.11 11/20/2021   HGBA1C 5.9 11/20/2021    Assessment/Plan:  Encounter for weight management  -Body mass index is 34.61 kg/m.  - Plan: Liraglutide -Weight Management (SAXENDA) 18 MG/3ML SOPN  Class 1 obesity due to excess calories without serious comorbidity with body mass index (BMI) of 34.0 to 34.9 in adult Body mass index is 34.61 kg/m. -Continue Saxenda 3 mg subq daily -Continue lifestyle modifications -Plan: Liraglutide-weight management [place]gy)  18 mg/ 3 mL SOPN  F/u in 2-3 months  Grier Mitts, MD

## 2022-05-16 ENCOUNTER — Encounter: Payer: Self-pay | Admitting: Family Medicine

## 2022-05-28 ENCOUNTER — Encounter: Payer: Self-pay | Admitting: Family Medicine

## 2022-06-17 ENCOUNTER — Other Ambulatory Visit (HOSPITAL_COMMUNITY): Payer: Self-pay

## 2022-06-17 ENCOUNTER — Encounter: Payer: Self-pay | Admitting: Family Medicine

## 2022-06-24 ENCOUNTER — Telehealth: Payer: Self-pay

## 2022-06-24 NOTE — Telephone Encounter (Signed)
PA started for Saxenda. Key [id number].

## 2022-06-24 NOTE — Telephone Encounter (Signed)
PA sent, key [id number]

## 2022-06-26 NOTE — Telephone Encounter (Signed)
PA approved for Saxenda 18 MG/3 ML.

## 2022-07-17 ENCOUNTER — Other Ambulatory Visit (HOSPITAL_COMMUNITY): Payer: Self-pay

## 2022-07-24 ENCOUNTER — Telehealth: Payer: Self-pay

## 2022-07-24 NOTE — Telephone Encounter (Signed)
[employer] PA form completed and faxed for saxenda '18MG'$ /3ML. Confirmation rec'd.

## 2022-07-28 ENCOUNTER — Other Ambulatory Visit (HOSPITAL_COMMUNITY): Payer: Self-pay

## 2022-07-29 ENCOUNTER — Other Ambulatory Visit (HOSPITAL_COMMUNITY): Payer: Self-pay

## 2022-08-08 ENCOUNTER — Other Ambulatory Visit (HOSPITAL_COMMUNITY): Payer: Self-pay

## 2022-08-09 ENCOUNTER — Other Ambulatory Visit (HOSPITAL_COMMUNITY): Payer: Self-pay

## 2022-08-12 ENCOUNTER — Other Ambulatory Visit (HOSPITAL_COMMUNITY): Payer: Self-pay

## 2022-09-02 ENCOUNTER — Other Ambulatory Visit: Payer: Self-pay | Admitting: Family Medicine

## 2022-09-02 DIAGNOSIS — Z1231 Encounter for screening mammogram for malignant neoplasm of breast: Secondary | ICD-10-CM

## 2022-09-08 ENCOUNTER — Other Ambulatory Visit (HOSPITAL_COMMUNITY): Payer: Self-pay

## 2022-10-10 ENCOUNTER — Other Ambulatory Visit: Payer: Self-pay | Admitting: Family Medicine

## 2022-10-10 DIAGNOSIS — Z1231 Encounter for screening mammogram for malignant neoplasm of breast: Secondary | ICD-10-CM

## 2022-10-18 ENCOUNTER — Other Ambulatory Visit (HOSPITAL_COMMUNITY): Payer: Self-pay

## 2022-10-20 ENCOUNTER — Other Ambulatory Visit (HOSPITAL_COMMUNITY): Payer: Self-pay

## 2022-10-30 DIAGNOSIS — Z1231 Encounter for screening mammogram for malignant neoplasm of breast: Secondary | ICD-10-CM

## 2022-11-18 ENCOUNTER — Other Ambulatory Visit: Payer: Self-pay

## 2022-11-22 ENCOUNTER — Other Ambulatory Visit (HOSPITAL_COMMUNITY): Payer: Self-pay

## 2022-11-28 ENCOUNTER — Encounter: Payer: Self-pay | Admitting: Family Medicine

## 2022-11-28 ENCOUNTER — Ambulatory Visit (INDEPENDENT_AMBULATORY_CARE_PROVIDER_SITE_OTHER): Payer: Commercial Managed Care - PPO | Admitting: Family Medicine

## 2022-11-28 ENCOUNTER — Other Ambulatory Visit (HOSPITAL_COMMUNITY): Payer: Self-pay

## 2022-11-28 VITALS — BP 104/64 | HR 78 | Temp 98.0°F | Ht 60.5 in | Wt 177.2 lb

## 2022-11-28 DIAGNOSIS — D563 Thalassemia minor: Secondary | ICD-10-CM

## 2022-11-28 DIAGNOSIS — E6609 Other obesity due to excess calories: Secondary | ICD-10-CM | POA: Diagnosis not present

## 2022-11-28 DIAGNOSIS — E559 Vitamin D deficiency, unspecified: Secondary | ICD-10-CM | POA: Diagnosis not present

## 2022-11-28 DIAGNOSIS — Z6834 Body mass index (BMI) 34.0-34.9, adult: Secondary | ICD-10-CM

## 2022-11-28 DIAGNOSIS — Z0000 Encounter for general adult medical examination without abnormal findings: Secondary | ICD-10-CM

## 2022-11-28 DIAGNOSIS — R7303 Prediabetes: Secondary | ICD-10-CM | POA: Diagnosis not present

## 2022-11-28 DIAGNOSIS — E7841 Elevated Lipoprotein(a): Secondary | ICD-10-CM | POA: Insufficient documentation

## 2022-11-28 LAB — CBC WITH DIFFERENTIAL/PLATELET
Basophils Absolute: 0 10*3/uL (ref 0.0–0.1)
Basophils Relative: 0.4 % (ref 0.0–3.0)
Eosinophils Absolute: 0.1 10*3/uL (ref 0.0–0.7)
Eosinophils Relative: 1.9 % (ref 0.0–5.0)
HCT: 36.7 % (ref 36.0–46.0)
Hemoglobin: 12 g/dL (ref 12.0–15.0)
Lymphocytes Relative: 38.6 % (ref 12.0–46.0)
Lymphs Abs: 2.4 10*3/uL (ref 0.7–4.0)
MCHC: 32.6 g/dL (ref 30.0–36.0)
MCV: 70.2 fl — ABNORMAL LOW (ref 78.0–100.0)
Monocytes Absolute: 0.3 10*3/uL (ref 0.1–1.0)
Monocytes Relative: 5.2 % (ref 3.0–12.0)
Neutro Abs: 3.3 10*3/uL (ref 1.4–7.7)
Neutrophils Relative %: 53.9 % (ref 43.0–77.0)
Platelets: 350 10*3/uL (ref 150.0–400.0)
RBC: 5.23 Mil/uL — ABNORMAL HIGH (ref 3.87–5.11)
RDW: 16.7 % — ABNORMAL HIGH (ref 11.5–15.5)
WBC: 6.2 10*3/uL (ref 4.0–10.5)

## 2022-11-28 LAB — COMPREHENSIVE METABOLIC PANEL
ALT: 8 U/L (ref 0–35)
AST: 11 U/L (ref 0–37)
Albumin: 4.3 g/dL (ref 3.5–5.2)
Alkaline Phosphatase: 63 U/L (ref 39–117)
BUN: 9 mg/dL (ref 6–23)
CO2: 26 mEq/L (ref 19–32)
Calcium: 9.2 mg/dL (ref 8.4–10.5)
Chloride: 102 mEq/L (ref 96–112)
Creatinine, Ser: 0.78 mg/dL (ref 0.40–1.20)
GFR: 93.36 mL/min (ref >60.00)
Glucose, Bld: 73 mg/dL (ref 70–99)
Potassium: 4.3 mEq/L (ref 3.5–5.1)
Sodium: 137 mEq/L (ref 135–145)
Total Bilirubin: 0.9 mg/dL (ref 0.2–1.2)
Total Protein: 8.3 g/dL (ref 6.0–8.3)

## 2022-11-28 LAB — LIPID PANEL
Cholesterol: 178 mg/dL (ref 0–200)
HDL: 45.2 mg/dL (ref >39.00)
LDL Cholesterol: 119 mg/dL — ABNORMAL HIGH (ref 0–99)
NonHDL: 133.11
Total CHOL/HDL Ratio: 4
Triglycerides: 72 mg/dL (ref 0.0–149.0)
VLDL: 14.4 mg/dL (ref 0.0–40.0)

## 2022-11-28 LAB — HEMOGLOBIN A1C: Hgb A1c MFr Bld: 5.7 % (ref 4.6–6.5)

## 2022-11-28 LAB — TSH: TSH: 1.3 u[IU]/mL (ref 0.35–5.50)

## 2022-11-28 LAB — VITAMIN D 25 HYDROXY (VIT D DEFICIENCY, FRACTURES): VITD: 12.6 ng/mL — ABNORMAL LOW (ref 30.00–100.00)

## 2022-11-28 LAB — T4, FREE: Free T4: 0.98 ng/dL (ref 0.60–1.60)

## 2022-11-28 MED ORDER — TIRZEPATIDE-WEIGHT MANAGEMENT 2.5 MG/0.5ML ~~LOC~~ SOAJ
2.5000 mg | SUBCUTANEOUS | 0 refills | Status: DC
  Filled 2022-11-28 – 2022-12-25 (×2): qty 2, 28d supply, fill #0

## 2022-11-28 MED ORDER — TIRZEPATIDE-WEIGHT MANAGEMENT 5 MG/0.5ML ~~LOC~~ SOAJ
5.0000 mg | SUBCUTANEOUS | 1 refills | Status: DC
  Filled 2022-11-28 – 2023-02-02 (×2): qty 2, 28d supply, fill #0

## 2022-11-28 NOTE — Assessment & Plan Note (Signed)
-  Body mass index is 34.04 kg/m.  Will d/c liraglutide as no change in weight noted in the last 6 months.  Will start Zepbound 2.5 mg weekly x 4 weeks, then increase to 5 mg weekly x 4 weeks.  Continue incremental increase if effective.  Continued lifestyle modifications.

## 2022-11-28 NOTE — Assessment & Plan Note (Signed)
-  Vitamin D 5.86 on 11/20/2021.

## 2022-11-28 NOTE — Assessment & Plan Note (Signed)
-  Hemoglobin A1c 5.9% on 11/20/2021.  Continue lifestyle modifications.

## 2022-11-28 NOTE — Assessment & Plan Note (Signed)
Stable

## 2022-11-28 NOTE — Progress Notes (Signed)
Established Patient Office Visit   Subjective  Patient ID: Elizabeth Benson, female    DOB: August 22, 1981  Age: 42 y.o. MRN: [id number]  Chief Complaint  Patient presents with   Annual Exam    Patient is a 42 year old female seen for CPE and follow-up.  Patient doing well overall.  Endorses difficulty losing weight despite using liraglutide.  Currently using 3 mg weekly.  Weight unchanged from July 2032, 177 lbs.  Pt working on diet changes, but  notes the holidays posed challenges due to gatherings with family.  Pt joined [employer] and plans to start exercising there in the next few wks.      ROS Negative unless stated above    Objective:     BP 104/64   Pulse 78   Temp 98 F (36.7 C) (Oral)   Ht 5' 0.5" (1.537 m)   Wt 177 lb 3.2 oz (80.4 kg)   LMP  (LMP Unknown)   SpO2 98%   BMI 34.04 kg/m    Physical Exam Constitutional:      Appearance: Normal appearance.  HENT:     Head: Normocephalic and atraumatic.     Right Ear: Tympanic membrane, ear canal and external ear normal.     Left Ear: Tympanic membrane, ear canal and external ear normal.     Nose: Nose normal.     Mouth/Throat:     Mouth: Mucous membranes are moist.     Pharynx: No oropharyngeal exudate or posterior oropharyngeal erythema.  Eyes:     General: No scleral icterus.    Extraocular Movements: Extraocular movements intact.     Conjunctiva/sclera: Conjunctivae normal.     Pupils: Pupils are equal, round, and reactive to light.  Neck:     Thyroid: No thyromegaly.  Cardiovascular:     Rate and Rhythm: Normal rate and regular rhythm.     Pulses: Normal pulses.     Heart sounds: Normal heart sounds. No murmur heard.    No friction rub.  Pulmonary:     Effort: Pulmonary effort is normal.     Breath sounds: Normal breath sounds. No wheezing, rhonchi or rales.  Abdominal:     General: Bowel sounds are normal.     Palpations: Abdomen is soft.     Tenderness: There is no abdominal tenderness.   Musculoskeletal:        General: No deformity. Normal range of motion.  Lymphadenopathy:     Cervical: No cervical adenopathy.  Skin:    General: Skin is warm and dry.     Findings: No lesion.  Neurological:     General: No focal deficit present.     Mental Status: She is alert and oriented to person, place, and time.  Psychiatric:        Mood and Affect: Mood normal.        Thought Content: Thought content normal.       11/28/2022   11:57 AM 05/09/2022   11:34 AM 01/01/2022    1:16 PM  Depression screen PHQ 2/9  Decreased Interest 0 0 0  Down, Depressed, Hopeless 0 0 0  PHQ - 2 Score 0 0 0  Altered sleeping 0 3 1  Tired, decreased energy 1 1 0  Change in appetite '1 1 1  '$ Feeling bad or failure about yourself  0 0 0  Trouble concentrating 0 0 0  Moving slowly or fidgety/restless 0 0 0  Suicidal thoughts 0 0 0  PHQ-9 Score 2 5  2  Difficult doing work/chores Not difficult at all Not difficult at all       11/28/2022   11:57 AM 09/07/2020    1:58 PM 12/21/2019    1:37 PM 07/13/2019    9:43 AM  GAD 7 : Generalized Anxiety Score  Nervous, Anxious, on Edge 0 '3 2 2  '$ Control/stop worrying 0 '3 2 3  '$ Worry too much - different things 0 '3 2 3  '$ Trouble relaxing 0 '1 1 2  '$ Restless 0 0 1 1  Easily annoyed or irritable '1 3 2 2  '$ Afraid - awful might happen 0 '3 2 3  '$ Total GAD 7 Score '1 16 12 16  '$ Anxiety Difficulty Not difficult at all Somewhat difficult Somewhat difficult Somewhat difficult      No results found for any visits on 11/28/22.    Assessment & Plan:  Well adult exam -Anticipatory guidance given including wearing seatbelts, smoke detectors in the home, increasing physical activity, increasing p.o. intake of water and vegetables. -2/2 history of hysterectomy.  Needs to follow-up with OB/GYN yearly.  Mammogram scheduled next week.  Colonoscopy not yet indicated 2/2 age.  Immunizations reviewed.  Appears Td vaccine given 07/31/2017, source [facility].  Class 1 obesity due to excess  calories without serious comorbidity with body mass index (BMI) of 34.0 to 34.9 in adult Assessment & Plan: -Body mass index is 34.04 kg/m.  Will d/c liraglutide as no change in weight noted in the last 6 months.  Will start Zepbound 2.5 mg weekly x 4 weeks, then increase to 5 mg weekly x 4 weeks.  Continue incremental increase if effective.  Continued lifestyle modifications.   Orders: -     Tirzepatide-Weight Management; Inject 2.5 mg into the skin once a week.  Dispense: 2 mL; Refill: 0 -     Tirzepatide-Weight Management; Inject 5 mg into the skin once a week.  Dispense: 2 mL; Refill: 1 -     CBC with Differential/Platelet -     TSH -     T4, free -     Lipid panel -     Comprehensive metabolic panel  Prediabetes Assessment & Plan: -Hemoglobin A1c 5.9% on 11/20/2021.  Continue lifestyle modifications.  Orders: -     Hemoglobin A1c -     Lipid panel  Elevated lipoprotein(a) Assessment & Plan: LDL 127, total cholesterol 188, HDL 47.4, triglycerides 68 on 11/20/2021.  Lifestyle modifications  Orders: -     Lipid panel -     Comprehensive metabolic panel  Vitamin D deficiency Assessment & Plan: -Vitamin D 5.86 on 11/20/2021.  Orders: -     VITAMIN D 25 Hydroxy (Vit-D Deficiency, Fractures)  Thalassemia minor Assessment & Plan: Stable  Orders: -     CBC with Differential/Platelet    Return in about 3 months (around 02/27/2023).   Billie Ruddy, MD

## 2022-11-28 NOTE — Assessment & Plan Note (Signed)
LDL 127, total cholesterol 188, HDL 47.4, triglycerides 68 on 11/20/2021.  Lifestyle modifications

## 2022-11-28 NOTE — Patient Instructions (Addendum)
The new prescription for Zepbound was sent to your pharmacy.  It is a weekly injection, starting at 2.5 mg weekly for 4 weeks then increasing to 5 mg weekly.  If the medication is working we can send in further refills.

## 2022-12-01 ENCOUNTER — Ambulatory Visit
Admission: RE | Admit: 2022-12-01 | Discharge: 2022-12-01 | Disposition: A | Discharge status: Home / Self Care | Payer: Commercial Managed Care - PPO | Source: Ambulatory Visit

## 2022-12-01 ENCOUNTER — Other Ambulatory Visit: Payer: Self-pay | Admitting: Family Medicine

## 2022-12-01 ENCOUNTER — Other Ambulatory Visit (HOSPITAL_COMMUNITY): Payer: Self-pay

## 2022-12-01 DIAGNOSIS — Z1231 Encounter for screening mammogram for malignant neoplasm of breast: Secondary | ICD-10-CM | POA: Diagnosis not present

## 2022-12-01 DIAGNOSIS — E559 Vitamin D deficiency, unspecified: Secondary | ICD-10-CM

## 2022-12-01 MED ORDER — VITAMIN D (ERGOCALCIFEROL) 1.25 MG (50000 UNIT) PO CAPS
50000.0000 [IU] | ORAL_CAPSULE | ORAL | 0 refills | Status: DC
  Filled 2022-12-01: qty 12, 84d supply, fill #0

## 2022-12-04 ENCOUNTER — Other Ambulatory Visit (HOSPITAL_COMMUNITY): Payer: Self-pay

## 2022-12-09 ENCOUNTER — Encounter: Payer: Self-pay | Admitting: Family Medicine

## 2022-12-19 ENCOUNTER — Other Ambulatory Visit (HOSPITAL_COMMUNITY): Payer: Self-pay

## 2022-12-23 ENCOUNTER — Telehealth: Payer: Self-pay

## 2022-12-23 NOTE — Telephone Encounter (Signed)
PA on Zepbound 2.62m  ACarlyn Deshler(Key: [id number] PA Case ID #: [id number] #: [id number]

## 2022-12-24 NOTE — Telephone Encounter (Signed)
Fyi  The request has been approved. The authorization is effective from 12/24/2022 to 06/22/2023, as long as the member is enrolled in their current health plan.  Pt was inform of the update. Advise pt to contact pharmacy. Verbalized understanding.

## 2022-12-25 ENCOUNTER — Other Ambulatory Visit (HOSPITAL_COMMUNITY): Payer: Self-pay

## 2023-02-02 ENCOUNTER — Other Ambulatory Visit (HOSPITAL_COMMUNITY): Payer: Self-pay

## 2023-02-02 DIAGNOSIS — Z01419 Encounter for gynecological examination (general) (routine) without abnormal findings: Secondary | ICD-10-CM | POA: Diagnosis not present

## 2023-02-02 DIAGNOSIS — N952 Postmenopausal atrophic vaginitis: Secondary | ICD-10-CM | POA: Diagnosis not present

## 2023-02-02 DIAGNOSIS — Z6835 Body mass index (BMI) 35.0-35.9, adult: Secondary | ICD-10-CM | POA: Diagnosis not present

## 2023-02-02 DIAGNOSIS — Z90711 Acquired absence of uterus with remaining cervical stump: Secondary | ICD-10-CM | POA: Diagnosis not present

## 2023-02-02 DIAGNOSIS — Z1239 Encounter for other screening for malignant neoplasm of breast: Secondary | ICD-10-CM | POA: Diagnosis not present

## 2023-02-05 ENCOUNTER — Other Ambulatory Visit (HOSPITAL_COMMUNITY): Payer: Self-pay

## 2023-02-10 ENCOUNTER — Other Ambulatory Visit (HOSPITAL_COMMUNITY): Payer: Self-pay

## 2023-02-11 ENCOUNTER — Other Ambulatory Visit (HOSPITAL_COMMUNITY): Payer: Self-pay

## 2023-03-27 ENCOUNTER — Encounter: Payer: Self-pay | Admitting: Family Medicine

## 2023-03-27 ENCOUNTER — Ambulatory Visit: Payer: Commercial Managed Care - PPO | Admitting: Family Medicine

## 2023-03-27 VITALS — BP 102/76 | HR 64 | Temp 98.7°F | Wt 186.8 lb

## 2023-03-27 DIAGNOSIS — E669 Obesity, unspecified: Secondary | ICD-10-CM

## 2023-03-27 DIAGNOSIS — Z6835 Body mass index (BMI) 35.0-35.9, adult: Secondary | ICD-10-CM

## 2023-03-27 DIAGNOSIS — R42 Dizziness and giddiness: Secondary | ICD-10-CM

## 2023-03-27 NOTE — Progress Notes (Signed)
   Established Patient Office Visit   Subjective  Patient ID: Elizabeth Benson, female    DOB: [birth year]  Age: 42 y.o. MRN: [id number]  Chief Complaint  Patient presents with   Dizziness    Last Friday leaned back and soon as she sat up, got extremely dizzy and started vomiting. Has not been as bad but cannot lay or lean to left side or room will start spinning.     Patient is a 42 year old female seen for acute concern.  6 days ago while sitting patient leaned her head back toward the left then returned head to neutral position.  At which time patient experienced dizziness resulting in nausea and emesis.  Patient is never experienced this before.  States symptoms have improved since that day.  Taking her time when getting up in the morning.  Unable to lay on left side at night as causes dizziness.  Denies headaches, allergy symptoms, changes in vision.  Zepbound no longer covered by insurance.  Patient endorses 9 pound weight gain since January.  Patient has been off med times months.  Considering starting Alli.      ROS Negative unless stated above    Objective:     BP 102/76 (BP Location: Right Arm, Patient Position: Sitting, Cuff Size: Normal)   Pulse 64   Temp 98.7 F (37.1 C) (Oral)   Wt 186 lb 12.8 oz (84.7 kg)   LMP  (LMP Unknown)   SpO2 95%   BMI 35.88 kg/m  BP Readings from Last 3 Encounters:  03/27/23 102/76  11/28/22 104/64  05/09/22 104/70   Wt Readings from Last 3 Encounters:  03/27/23 186 lb 12.8 oz (84.7 kg)  11/28/22 177 lb 3.2 oz (80.4 kg)  05/09/22 177 lb 3.2 oz (80.4 kg)      Physical Exam Constitutional:      General: She is not in acute distress.    Appearance: Normal appearance.  HENT:     Head: Normocephalic and atraumatic.     Comments: Epley maneuver performed.    Nose: Nose normal.     Mouth/Throat:     Mouth: Mucous membranes are moist.  Eyes:     Extraocular Movements: Extraocular movements intact.     Right eye: No  nystagmus.     Left eye: No nystagmus.     Conjunctiva/sclera: Conjunctivae normal.  Cardiovascular:     Rate and Rhythm: Normal rate and regular rhythm.     Heart sounds: Normal heart sounds. No murmur heard.    No gallop.  Pulmonary:     Effort: Pulmonary effort is normal. No respiratory distress.     Breath sounds: Normal breath sounds. No wheezing, rhonchi or rales.  Skin:    General: Skin is warm and dry.  Neurological:     Mental Status: She is alert and oriented to person, place, and time.      No results found for any visits on 03/27/23.    Assessment & Plan:  Vertigo -Now resolved -Epley maneuver performed in clinic. -Patient can try at home if needed. -Consider OTC medication for return of symptoms. -Given strict precautions  Class 2 obesity without serious comorbidity with body mass index (BMI) of 35.0 to 35.9 in adult, unspecified obesity type -Body mass index is 35.88 kg/m. -Will d/c zepbound is no longer covered by insurance -Pt to consider other weight loss medication options.  Return if symptoms worsen or fail to improve.   Deeann Saint, MD

## 2023-05-13 ENCOUNTER — Other Ambulatory Visit: Payer: Self-pay | Admitting: Oncology

## 2023-05-13 DIAGNOSIS — Z006 Encounter for examination for normal comparison and control in clinical research program: Secondary | ICD-10-CM

## 2023-05-27 ENCOUNTER — Ambulatory Visit: Payer: Commercial Managed Care - PPO | Admitting: Family Medicine

## 2023-05-29 ENCOUNTER — Ambulatory Visit: Payer: Commercial Managed Care - PPO | Admitting: Family Medicine

## 2023-09-08 DIAGNOSIS — F4312 Post-traumatic stress disorder, chronic: Secondary | ICD-10-CM | POA: Diagnosis not present

## 2023-09-17 DIAGNOSIS — F4312 Post-traumatic stress disorder, chronic: Secondary | ICD-10-CM | POA: Diagnosis not present

## 2023-09-25 DIAGNOSIS — F4312 Post-traumatic stress disorder, chronic: Secondary | ICD-10-CM | POA: Diagnosis not present

## 2023-10-09 ENCOUNTER — Other Ambulatory Visit: Payer: Self-pay | Admitting: Family Medicine

## 2023-10-09 DIAGNOSIS — F4312 Post-traumatic stress disorder, chronic: Secondary | ICD-10-CM | POA: Diagnosis not present

## 2023-10-09 DIAGNOSIS — Z1231 Encounter for screening mammogram for malignant neoplasm of breast: Secondary | ICD-10-CM

## 2023-10-16 DIAGNOSIS — F4312 Post-traumatic stress disorder, chronic: Secondary | ICD-10-CM | POA: Diagnosis not present

## 2023-10-22 ENCOUNTER — Other Ambulatory Visit (HOSPITAL_COMMUNITY): Payer: Self-pay

## 2023-10-22 DIAGNOSIS — M25562 Pain in left knee: Secondary | ICD-10-CM | POA: Diagnosis not present

## 2023-10-22 MED ORDER — NAPROXEN 500 MG PO TABS
ORAL_TABLET | ORAL | 0 refills | Status: DC
  Filled 2023-10-22: qty 60, 30d supply, fill #0

## 2023-10-23 DIAGNOSIS — F4312 Post-traumatic stress disorder, chronic: Secondary | ICD-10-CM | POA: Diagnosis not present

## 2023-11-06 DIAGNOSIS — F4312 Post-traumatic stress disorder, chronic: Secondary | ICD-10-CM | POA: Diagnosis not present

## 2023-11-10 ENCOUNTER — Other Ambulatory Visit (HOSPITAL_COMMUNITY): Payer: Self-pay

## 2023-11-20 DIAGNOSIS — F4312 Post-traumatic stress disorder, chronic: Secondary | ICD-10-CM | POA: Diagnosis not present

## 2023-11-27 DIAGNOSIS — F4312 Post-traumatic stress disorder, chronic: Secondary | ICD-10-CM | POA: Diagnosis not present

## 2023-11-30 ENCOUNTER — Encounter: Payer: Commercial Managed Care - PPO | Admitting: Family Medicine

## 2023-12-03 ENCOUNTER — Ambulatory Visit
Admission: RE | Admit: 2023-12-03 | Discharge: 2023-12-03 | Disposition: A | Discharge status: Home / Self Care | Payer: Commercial Managed Care - PPO | Source: Ambulatory Visit

## 2023-12-03 DIAGNOSIS — Z1231 Encounter for screening mammogram for malignant neoplasm of breast: Secondary | ICD-10-CM | POA: Diagnosis not present

## 2023-12-04 ENCOUNTER — Encounter: Payer: Commercial Managed Care - PPO | Admitting: Family Medicine

## 2023-12-04 DIAGNOSIS — F4312 Post-traumatic stress disorder, chronic: Secondary | ICD-10-CM | POA: Diagnosis not present

## 2023-12-08 ENCOUNTER — Other Ambulatory Visit: Payer: Self-pay | Admitting: Family Medicine

## 2023-12-08 DIAGNOSIS — R928 Other abnormal and inconclusive findings on diagnostic imaging of breast: Secondary | ICD-10-CM

## 2023-12-18 ENCOUNTER — Ambulatory Visit
Admission: RE | Admit: 2023-12-18 | Discharge: 2023-12-18 | Disposition: A | Discharge status: Home / Self Care | Payer: Commercial Managed Care - PPO | Source: Ambulatory Visit | Attending: Family Medicine | Admitting: Family Medicine

## 2023-12-18 DIAGNOSIS — R928 Other abnormal and inconclusive findings on diagnostic imaging of breast: Secondary | ICD-10-CM

## 2023-12-18 DIAGNOSIS — N6325 Unspecified lump in the left breast, overlapping quadrants: Secondary | ICD-10-CM | POA: Diagnosis not present

## 2023-12-18 DIAGNOSIS — F4312 Post-traumatic stress disorder, chronic: Secondary | ICD-10-CM | POA: Diagnosis not present

## 2023-12-25 ENCOUNTER — Ambulatory Visit: Payer: Commercial Managed Care - PPO | Admitting: Family Medicine

## 2023-12-25 ENCOUNTER — Encounter: Payer: Self-pay | Admitting: Family Medicine

## 2023-12-25 VITALS — BP 146/74 | HR 105 | Temp 98.5°F | Ht 60.5 in | Wt 196.4 lb

## 2023-12-25 DIAGNOSIS — Z0000 Encounter for general adult medical examination without abnormal findings: Secondary | ICD-10-CM

## 2023-12-25 DIAGNOSIS — Z6837 Body mass index (BMI) 37.0-37.9, adult: Secondary | ICD-10-CM | POA: Diagnosis not present

## 2023-12-25 DIAGNOSIS — F4312 Post-traumatic stress disorder, chronic: Secondary | ICD-10-CM | POA: Diagnosis not present

## 2023-12-25 DIAGNOSIS — R7303 Prediabetes: Secondary | ICD-10-CM

## 2023-12-25 DIAGNOSIS — E7841 Elevated Lipoprotein(a): Secondary | ICD-10-CM | POA: Diagnosis not present

## 2023-12-25 DIAGNOSIS — E559 Vitamin D deficiency, unspecified: Secondary | ICD-10-CM

## 2023-12-25 DIAGNOSIS — E66812 Obesity, class 2: Secondary | ICD-10-CM

## 2023-12-25 NOTE — Progress Notes (Signed)
 Established Patient Office Visit   Subjective  Patient ID: Elizabeth Benson, female    DOB: 12-08-1980  Age: 43 y.o. MRN: [id number]  Chief Complaint  Patient presents with   Annual Exam    Patient is a 43 year old female seen for CPE.  Not fasting.  Patient states BP may be elevated due to rushing from another appointment and eating [employer] for lunch.  Patient expresses frustration with weight gain.  In the past tried weight loss medication but it stopped being covered by [employer].  Patient less active due to working from home.  Patient mentions she started counseling which has been helpful.    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   Class 1 obesity due to excess calories without serious comorbidity with body mass index (BMI) of 34.0 to 34.9 in adult 11/28/2022   Vitamin D deficiency 11/25/2021   Endometriosis 03/22/2012   Anxiety 03/22/2012   Endometriosis    Thalassemia minor 01/08/2012   Past Medical History:  Diagnosis Date   Anxiety    no meds   Depression    no meds   Endometriosis    History of cervical dysplasia    CIN I   HSV infection    Pelvic pain in female    PONV (postoperative nausea and vomiting)    SVD (spontaneous vaginal delivery)    x 2   Thalassemia minor    Thalassemia trait    Wears glasses    Past Surgical History:  Procedure Laterality Date   LAPAROSCOPIC VAGINAL HYSTERECTOMY WITH SALPINGECTOMY Bilateral 10/12/2017   Procedure: LAPAROSCOPy, VAGINAL HYSTERECTOMY WITH BILATERAL  SALPINGECTOMY;  Surgeon: Kirkland Hun, MD;  Location: [facility];  Service: Gynecology;  Laterality: Bilateral;  3 Hours   LAPAROSCOPY  2002   for endometriosis   LAPAROSCOPY  08/06/2012   Procedure: LAPAROSCOPY OPERATIVE;  Surgeon: Kirkland Hun, MD;  Location: [facility];  Service: Gynecology;  Laterality: N/A;  Laparoscopic Pelvic Biopsies   LAPAROSCOPY N/A 09/04/2015    Procedure: LAPAROSCOPY OPERATIVE with peritoneal biopsies;  Surgeon: Kirkland Hun, MD;  Location[facility];  Service: Gynecology;  Laterality: N/A;   WISDOM TOOTH EXTRACTION  1990's   Social History   Tobacco Use   Smoking status: Former    Current packs/day: 0.00    Average packs/day: 0.3 packs/day for 8.0 years (2.0 ttl pk-yrs)    Types: Cigarettes    Start date: 08/09/2003    Quit date: 08/09/2011    Years since quitting: 12.3   Smokeless tobacco: Never  Vaping Use   Vaping status: Never Used  Substance Use Topics   Alcohol use: Yes    Comment: occasional    Drug use: No   Family History  Problem Relation Age of Onset   Hypertension Mother    Ovarian cancer Mother    Diabetes Mother    Hypertension Maternal Aunt    Diabetes Maternal Aunt    Hypertension Maternal Uncle    Diabetes Maternal Uncle    Diabetes Paternal Aunt    Hypertension Paternal Aunt    Cancer Paternal Aunt        breast   Diabetes Paternal Uncle    Hypertension Paternal Uncle    Stroke Paternal Uncle    Hypertension Maternal Grandmother    Cancer Maternal Grandfather        prostate   Hypertension Maternal Grandfather    Stroke Maternal Grandfather  Diabetes Paternal Grandmother    Stroke Paternal Grandfather    Hypertension Paternal Grandfather    Hypertension Father    High Cholesterol Father    Allergies  Allergen Reactions   Other       ROS Negative unless stated above    Objective:     BP (!) 146/74 (BP Location: Left Arm, Patient Position: Sitting, Cuff Size: Large)   Pulse (!) 105   Temp 98.5 F (36.9 C) (Oral)   Ht 5' 0.5" (1.537 m)   Wt 196 lb 6.4 oz (89.1 kg)   LMP  (LMP Unknown)   SpO2 97%   BMI 37.73 kg/m  BP Readings from Last 3 Encounters:  12/25/23 (!) 146/74  03/27/23 102/76  11/28/22 104/64   Wt Readings from Last 3 Encounters:  12/25/23 196 lb 6.4 oz (89.1 kg)  03/27/23 186 lb 12.8 oz (84.7 kg)  11/28/22 177 lb 3.2 oz (80.4 kg)       Physical Exam Constitutional:      Appearance: Normal appearance.  HENT:     Head: Normocephalic and atraumatic.     Right Ear: Tympanic membrane, ear canal and external ear normal.     Left Ear: Tympanic membrane, ear canal and external ear normal.     Nose: Nose normal.     Mouth/Throat:     Mouth: Mucous membranes are moist.     Pharynx: No oropharyngeal exudate or posterior oropharyngeal erythema.  Eyes:     General: No scleral icterus.    Extraocular Movements: Extraocular movements intact.     Conjunctiva/sclera: Conjunctivae normal.     Pupils: Pupils are equal, round, and reactive to light.  Neck:     Thyroid: No thyromegaly.  Cardiovascular:     Rate and Rhythm: Normal rate and regular rhythm.     Pulses: Normal pulses.     Heart sounds: Normal heart sounds. No murmur heard.    No friction rub.  Pulmonary:     Effort: Pulmonary effort is normal.     Breath sounds: Normal breath sounds. No wheezing, rhonchi or rales.  Abdominal:     General: Bowel sounds are normal.     Palpations: Abdomen is soft.     Tenderness: There is no abdominal tenderness.  Musculoskeletal:        General: No deformity. Normal range of motion.  Lymphadenopathy:     Cervical: No cervical adenopathy.  Skin:    General: Skin is warm and dry.     Findings: No lesion.  Neurological:     General: No focal deficit present.     Mental Status: She is alert and oriented to person, place, and time.  Psychiatric:        Mood and Affect: Mood normal.        Thought Content: Thought content normal.    No results found for any visits on 12/25/23.    Assessment & Plan:  Well adult exam -Anticipatory guidance given including wearing seatbelts, smoke detectors in the home, increasing physical activity, increasing p.o. intake of water and vegetables. -will obtain labs next wk when fasting -Mammogram up-to-date done 12/03/2023 -Colonoscopy not yet indicated due to age -Immunizations reviewed.   Consider Tdap, influenza vaccines. -Pap not indicated 2/2 history of hysterectomy with salpingectomy 10/12/2017. -     CBC with Differential/Platelet; Future -     Comprehensive metabolic panel; Future -     Lipid panel; Future  Vitamin D deficiency -     VITAMIN  D 25 Hydroxy (Vit-D Deficiency, Fractures); Future  Prediabetes -Hemoglobin A1c 5.7% on 11/28/2022 -     Hemoglobin A1c; Future -     Amb Referral to Nutrition and Diabetic Education  Elevated lipoprotein(a) -     Lipid panel; Future  Class 2 severe obesity with serious comorbidity and body mass index (BMI) of 37.0 to 37.9 in adult, unspecified obesity type (HCC) -Body mass index is 37.73 kg/m. -Continue lifestyle modifications -Referral to nutrition for help with diet changes -Consider weight management clinic -     Comprehensive metabolic panel; Future -     Hemoglobin A1c; Future -     Lipid panel; Future -     TSH; Future -     VITAMIN D 25 Hydroxy (Vit-D Deficiency, Fractures); Future -     Amb Referral to Nutrition and Diabetic Education -     T4, free; Future  Elevated blood-pressure reading in clinic without diagnosis of hypertension -Elevated -Recheck -Lifestyle modifications including decreasing sodium intake encouraged -Patient encouraged to monitor BP at home and keep a log to bring with her to clinic  Return in about 3 months (around 03/23/2024).   Deeann Saint, MD

## 2024-01-01 ENCOUNTER — Other Ambulatory Visit (INDEPENDENT_AMBULATORY_CARE_PROVIDER_SITE_OTHER): Payer: Commercial Managed Care - PPO

## 2024-01-01 DIAGNOSIS — R7303 Prediabetes: Secondary | ICD-10-CM

## 2024-01-01 DIAGNOSIS — E7841 Elevated Lipoprotein(a): Secondary | ICD-10-CM

## 2024-01-01 DIAGNOSIS — E66812 Obesity, class 2: Secondary | ICD-10-CM | POA: Diagnosis not present

## 2024-01-01 DIAGNOSIS — Z6837 Body mass index (BMI) 37.0-37.9, adult: Secondary | ICD-10-CM

## 2024-01-01 DIAGNOSIS — Z0000 Encounter for general adult medical examination without abnormal findings: Secondary | ICD-10-CM

## 2024-01-01 DIAGNOSIS — F4312 Post-traumatic stress disorder, chronic: Secondary | ICD-10-CM | POA: Diagnosis not present

## 2024-01-01 DIAGNOSIS — E559 Vitamin D deficiency, unspecified: Secondary | ICD-10-CM | POA: Diagnosis not present

## 2024-01-01 LAB — CBC WITH DIFFERENTIAL/PLATELET
Basophils Absolute: 0 10*3/uL (ref 0.0–0.1)
Basophils Relative: 0.4 % (ref 0.0–3.0)
Eosinophils Absolute: 0.1 10*3/uL (ref 0.0–0.7)
Eosinophils Relative: 2.4 % (ref 0.0–5.0)
HCT: 35.4 % — ABNORMAL LOW (ref 36.0–46.0)
Hemoglobin: 11.3 g/dL — ABNORMAL LOW (ref 12.0–15.0)
Lymphocytes Relative: 38.8 % (ref 12.0–46.0)
Lymphs Abs: 2.3 10*3/uL (ref 0.7–4.0)
MCHC: 32 g/dL (ref 30.0–36.0)
MCV: 71.1 fl — ABNORMAL LOW (ref 78.0–100.0)
Monocytes Absolute: 0.4 10*3/uL (ref 0.1–1.0)
Monocytes Relative: 6.7 % (ref 3.0–12.0)
Neutro Abs: 3 10*3/uL (ref 1.4–7.7)
Neutrophils Relative %: 51.7 % (ref 43.0–77.0)
Platelets: 340 10*3/uL (ref 150.0–400.0)
RBC: 4.97 Mil/uL (ref 3.87–5.11)
RDW: 16.8 % — ABNORMAL HIGH (ref 11.5–15.5)
WBC: 5.9 10*3/uL (ref 4.0–10.5)

## 2024-01-01 LAB — COMPREHENSIVE METABOLIC PANEL
ALT: 11 U/L (ref 0–35)
AST: 11 U/L (ref 0–37)
Albumin: 4.2 g/dL (ref 3.5–5.2)
Alkaline Phosphatase: 58 U/L (ref 39–117)
BUN: 7 mg/dL (ref 6–23)
CO2: 26 meq/L (ref 19–32)
Calcium: 9 mg/dL (ref 8.4–10.5)
Chloride: 104 meq/L (ref 96–112)
Creatinine, Ser: 0.8 mg/dL (ref 0.40–1.20)
GFR: 89.87 mL/min (ref >60.00)
Glucose, Bld: 81 mg/dL (ref 70–99)
Potassium: 4.2 meq/L (ref 3.5–5.1)
Sodium: 136 meq/L (ref 135–145)
Total Bilirubin: 0.7 mg/dL (ref 0.2–1.2)
Total Protein: 7.7 g/dL (ref 6.0–8.3)

## 2024-01-01 LAB — TSH: TSH: 1.26 u[IU]/mL (ref 0.35–5.50)

## 2024-01-01 LAB — LIPID PANEL
Cholesterol: 173 mg/dL (ref 0–200)
HDL: 40.2 mg/dL (ref >39.00)
LDL Cholesterol: 117 mg/dL — ABNORMAL HIGH (ref 0–99)
NonHDL: 133.03
Total CHOL/HDL Ratio: 4
Triglycerides: 78 mg/dL (ref 0.0–149.0)
VLDL: 15.6 mg/dL (ref 0.0–40.0)

## 2024-01-01 LAB — HEMOGLOBIN A1C: Hgb A1c MFr Bld: 5.9 % (ref 4.6–6.5)

## 2024-01-01 LAB — VITAMIN D 25 HYDROXY (VIT D DEFICIENCY, FRACTURES): VITD: 15.25 ng/mL — ABNORMAL LOW (ref 30.00–100.00)

## 2024-01-01 LAB — T4, FREE: Free T4: 0.89 ng/dL (ref 0.60–1.60)

## 2024-01-04 ENCOUNTER — Other Ambulatory Visit: Payer: Self-pay | Admitting: Family Medicine

## 2024-01-04 ENCOUNTER — Other Ambulatory Visit: Payer: Self-pay

## 2024-01-04 ENCOUNTER — Encounter: Payer: Self-pay | Admitting: Family Medicine

## 2024-01-04 ENCOUNTER — Other Ambulatory Visit (HOSPITAL_COMMUNITY): Payer: Self-pay

## 2024-01-04 DIAGNOSIS — E559 Vitamin D deficiency, unspecified: Secondary | ICD-10-CM

## 2024-01-04 MED ORDER — VITAMIN D (ERGOCALCIFEROL) 1.25 MG (50000 UNIT) PO CAPS
50000.0000 [IU] | ORAL_CAPSULE | ORAL | 0 refills | Status: DC
  Filled 2024-01-04: qty 12, 84d supply, fill #0

## 2024-01-08 DIAGNOSIS — F4312 Post-traumatic stress disorder, chronic: Secondary | ICD-10-CM | POA: Diagnosis not present

## 2024-01-18 DIAGNOSIS — F4312 Post-traumatic stress disorder, chronic: Secondary | ICD-10-CM | POA: Diagnosis not present

## 2024-01-25 ENCOUNTER — Other Ambulatory Visit: Payer: Self-pay | Admitting: Family Medicine

## 2024-01-25 DIAGNOSIS — N6489 Other specified disorders of breast: Secondary | ICD-10-CM

## 2024-01-29 DIAGNOSIS — F4312 Post-traumatic stress disorder, chronic: Secondary | ICD-10-CM | POA: Diagnosis not present

## 2024-02-03 DIAGNOSIS — Z1239 Encounter for other screening for malignant neoplasm of breast: Secondary | ICD-10-CM | POA: Diagnosis not present

## 2024-02-03 DIAGNOSIS — Z01419 Encounter for gynecological examination (general) (routine) without abnormal findings: Secondary | ICD-10-CM | POA: Diagnosis not present

## 2024-02-03 DIAGNOSIS — Z90710 Acquired absence of both cervix and uterus: Secondary | ICD-10-CM | POA: Diagnosis not present

## 2024-02-03 DIAGNOSIS — Z1339 Encounter for screening examination for other mental health and behavioral disorders: Secondary | ICD-10-CM | POA: Diagnosis not present

## 2024-02-05 DIAGNOSIS — F4312 Post-traumatic stress disorder, chronic: Secondary | ICD-10-CM | POA: Diagnosis not present

## 2024-02-10 ENCOUNTER — Encounter: Payer: Self-pay | Admitting: Dietician

## 2024-02-10 ENCOUNTER — Encounter: Attending: Family Medicine | Admitting: Dietician

## 2024-02-10 VITALS — Wt 192.7 lb

## 2024-02-10 DIAGNOSIS — R7303 Prediabetes: Secondary | ICD-10-CM | POA: Insufficient documentation

## 2024-02-10 NOTE — Patient Instructions (Addendum)
 Goals Established by Pt  Goal 1: eat at least 1 fruit and 1 vegetable every day  Goal 2: go walking for 20 minutes at least 3 days per week.   Goal 3: have something small in the morning (examples: greek yogurt, boiled egg on toast, peanut butter on toast, etc.)

## 2024-02-10 NOTE — Progress Notes (Signed)
 Medical Nutrition Therapy  Appointment Start time:  [phone]  Appointment End time:  [phone]  Primary concerns today: improving eating habits   Referral diagnosis: prediabetes, Employee Visit 1 Preferred learning style: no preference indicated Learning readiness: ready   NUTRITION ASSESSMENT   Anthropometrics   Wt 02/10/24: 192.7 lb  Clinical Medical Hx: anemia, anxiety, depression, prediabetes, HLD Medications: N/A Labs: 01/01/24: LDL 117, vit D 15, hemoglobin 11.3, A1c 5.9% Notable Signs/Symptoms: none reported Food Allergies: none  Lifestyle & Dietary Hx  Pt reports she feels like she doesn't eat enough and also feels she does not choose the right foods. Pt reports she works from home 7-4:30pm and feels it causes her to be more lazy.   Pt states both of her kids are in college, and reports she lives with her mom but her mom is currently stable so this is the first time in her life she feels she is taking care of herself. Pt states she started counseling around 4 months ago.  Pt reports she has reduced her soda intake, previously drinking a 20 oz mountain dew daily, and now rarely having soda but having lemonade around 3 times per week. Pt states she feels she eats out frequently, often for lunch and dinner, including chickfila, chinese food, or bagel sandwich. Pt states she is currently getting the kitchen remodeled which makes it difficult to cook. Pt states her dad has a small farm and she often gets fresh vegetables from him.   Pt states she has family history of diabetes.   Pt reports she has a dog and goes walking for about 10 minutes 3 times per week.    Estimated daily fluid intake: 32 oz Supplements: vitamin D Sleep: 10:30pm-6:30am Stress / self-care: low stress, enjoys reading and goes to therapy Current average weekly physical activity: ADLs  24-Hr Dietary Recall First Meal: none Snack: none Second Meal: 12pm: chickfila sandwich and fries and lemonade Snack:  apples Third Meal: out: [place] takeout, chickfila OR home: bbq ribs, cabbage Snack: none Beverages: coffee with cream and sugar, water, lemonade   NUTRITION DIAGNOSIS  NB-1.1 Food and nutrition-related knowledge deficit As related to lack of prior education by a registered dietitian.  As evidenced by pt report.   NUTRITION INTERVENTION  Nutrition education (E-1) on the following topics:   Prediabetes Prediabetes: Prediabetes is a condition where blood sugar levels are higher than normal but not yet high enough to be diagnosed as type 2 diabetes. A1C, or hemoglobin A1c, is a blood test that provides an average of a person's blood sugar levels over the past two to three months. It is commonly used to diagnose and monitor diabetes. For prediabetes, an A1C level between 5.7% and 6.4% typically is used to diagnose this. Here is how the A1C levels are generally categorized: Normal:  A1C below 5.7% Prediabetes:  A1C between 5.7% and 6.4% Diabetes:  A1C of 6.5% or higher When diagnosed with prediabetes, there are several lifestyle changes you can make to manage the condition: Healthy Eating:  Follow a well-balanced diet that includes a variety of fruits, vegetables, whole grains, lean proteins, and healthy fats. Monitor portion sizes and reduce intake of sugary and processed foods. Regular Physical Activity:  Engage in regular physical activity, such as brisk walking, cycling, or other aerobic exercises, for at least 150 minutes per week. Include strength training exercises at least twice a week. Weight Management: Achieve and maintain a healthy weight. Losing even a small amount of weight (3-5%) can significantly  improve insulin sensitivity.  Plate Method Fruits & Vegetables: Aim to fill half your plate with a variety of fruits and vegetables. They are rich in vitamins, minerals, and fiber, and can help reduce the risk of chronic diseases. Choose a colorful assortment of fruits and vegetables  to ensure you get a wide range of nutrients. Grains and Starches: Make at least half of your grain choices whole grains, such as brown rice, whole wheat bread, and oats. Whole grains provide fiber, which aids in digestion and healthy cholesterol levels. Aim for whole forms of starchy vegetables such as potatoes, sweet potatoes, beans, peas, and corn, which are fiber rich and provide many vitamins and minerals.  Protein: Incorporate lean sources of protein, such as poultry, fish, beans, nuts, and seeds, into your meals. Protein is essential for building and repairing tissues, staying full, balancing blood sugar, as well as supporting immune function. Dairy: Include low-fat or fat-free dairy products like milk, yogurt, and cheese in your diet. Dairy foods are excellent sources of calcium and vitamin D, which are crucial for bone health.    Handouts Provided Include  Plate Method  Learning Style & Readiness for Change Teaching method utilized: Visual & Auditory  Demonstrated degree of understanding via: Teach Back  Barriers to learning/adherence to lifestyle change: none  Goals Established by Pt  Goal 1: eat at least 1 fruit and 1 vegetable every day  Goal 2: go walking for 20 minutes at least 3 days per week.   Goal 3: have something small in the morning (examples: greek yogurt, boiled egg on toast, peanut butter on toast, etc.)   MONITORING & EVALUATION Dietary intake, weekly physical activity, and follow up in 6 weeks.  Next Steps  Patient is to call for questions.

## 2024-02-19 DIAGNOSIS — F4312 Post-traumatic stress disorder, chronic: Secondary | ICD-10-CM | POA: Diagnosis not present

## 2024-02-26 DIAGNOSIS — F4312 Post-traumatic stress disorder, chronic: Secondary | ICD-10-CM | POA: Diagnosis not present

## 2024-03-04 DIAGNOSIS — F4312 Post-traumatic stress disorder, chronic: Secondary | ICD-10-CM | POA: Diagnosis not present

## 2024-03-11 DIAGNOSIS — F4312 Post-traumatic stress disorder, chronic: Secondary | ICD-10-CM | POA: Diagnosis not present

## 2024-03-18 DIAGNOSIS — F4312 Post-traumatic stress disorder, chronic: Secondary | ICD-10-CM | POA: Diagnosis not present

## 2024-03-25 DIAGNOSIS — F4312 Post-traumatic stress disorder, chronic: Secondary | ICD-10-CM | POA: Diagnosis not present

## 2024-03-29 ENCOUNTER — Ambulatory Visit: Admitting: Dietician

## 2024-04-01 DIAGNOSIS — F4312 Post-traumatic stress disorder, chronic: Secondary | ICD-10-CM | POA: Diagnosis not present

## 2024-04-07 ENCOUNTER — Encounter: Payer: Self-pay | Admitting: Dietician

## 2024-04-07 ENCOUNTER — Encounter: Attending: Family Medicine | Admitting: Dietician

## 2024-04-07 VITALS — Wt 193.6 lb

## 2024-04-07 DIAGNOSIS — R7303 Prediabetes: Secondary | ICD-10-CM | POA: Insufficient documentation

## 2024-04-07 NOTE — Patient Instructions (Signed)
 New Goals Established by Pt  Goal 1: drink 48 oz of water a day (may add flavor).  Goal 2: incorporate a non-starchy vegetable with dinner.   Assessment of Previous Goals:   Goal 1: eat at least 1 fruit and 1 vegetable every day. - goal met, continue.  Goal 2: go walking for 20 minutes at least 3 days per week. - goal met, continue  Goal 3: have something small in the morning (examples: greek yogurt, boiled egg on toast, peanut butter on toast, etc.) - goal not met, continue.

## 2024-04-07 NOTE — Progress Notes (Signed)
 Medical Nutrition Therapy  Appointment Start time:  [phone] Appointment End time:  1630  Primary concerns today: improving eating habits   Referral diagnosis: prediabetes, Employee Visit 2 Preferred learning style: no preference indicated Learning readiness: ready   NUTRITION ASSESSMENT   Anthropometrics   Wt 04/07/24: 193.6 lb Wt 02/10/24: 192.7 lb  Clinical Medical Hx: anemia, anxiety, depression, prediabetes, HLD Medications: N/A Labs: 01/01/24: LDL 117, vit D 15, hemoglobin 11.3, A1c 5.9% Notable Signs/Symptoms: none reported Food Allergies: none  Lifestyle & Dietary Hx  Pt reports since previous visit her kitchen has been under remodel and she has not had much access to cooking, thus has been eating out more or having more simple meals at home (sandwich, cereal).   Pt states she bought a walking pad and has been walking 2-3 days per week for 45 minutes while working (works from home).   Pt reports she has not been sleeping well. Pt reports she notices her sleep has been worse since her kids have gotten home from college. Pt reports she notices her lack of sleep has effected her eating habits and mood.   Estimated daily fluid intake: 24-32 oz Supplements: vitamin D  Sleep: used to follow schedule of 10:30pm-6:30am, currently sleep varies 2-6 hours per night.  Stress / self-care: moderate stress, enjoys reading and goes to therapy Current average weekly physical activity: walking 2-3 days per week 45 minutes.   24-Hr Dietary Recall First Meal: none Snack: none Second Meal: 11am: bowl of cereal OR salad from the fresh market OR fast food Snack: bowl of fruit OR banana Third Meal: sandwich OR burger OR chickfila Snack: none Beverages: coffee with cream and sugar, water, 8-16 oz fruit juice   NUTRITION DIAGNOSIS  NB-1.1 Food and nutrition-related knowledge deficit As related to lack of prior education by a registered dietitian.  As evidenced by pt report.   NUTRITION  INTERVENTION  Nutrition education (E-1) on the following topics:   Sleep Impact of sleep on nutrition: Inadequate sleep can have significant impacts on nutrition by increasing appetite and cravings for high-calorie foods, altering food choices, and disrupting metabolic processes. This can lead to weight gain, poor glucose management, and impaired nutrient absorption. Additionally, sleep deprivation often results in reduced physical activity, further affecting overall health. Tips for better sleep: To improve sleep quality, maintain a consistent sleep schedule and create a relaxing bedtime routine. Optimize your sleep environment by keeping it cool, dark, and quiet, and limit screen time before bed. Be mindful of food and drink intake, engage in regular physical activity, and manage stress effectively. By prioritizing better sleep habits, you can positively influence your nutrition and overall well-being.  Prediabetes Prediabetes: Prediabetes is a condition where blood sugar levels are higher than normal but not yet high enough to be diagnosed as type 2 diabetes. A1C, or hemoglobin A1c, is a blood test that provides an average of a person's blood sugar levels over the past two to three months. It is commonly used to diagnose and monitor diabetes. For prediabetes, an A1C level between 5.7% and 6.4% typically is used to diagnose this. Here is how the A1C levels are generally categorized: Normal:  A1C below 5.7% Prediabetes:  A1C between 5.7% and 6.4% Diabetes:  A1C of 6.5% or higher When diagnosed with prediabetes, there are several lifestyle changes you can make to manage the condition: Healthy Eating:  Follow a well-balanced diet that includes a variety of fruits, vegetables, whole grains, lean proteins, and healthy fats. Monitor portion sizes  and reduce intake of sugary and processed foods. Regular Physical Activity:  Engage in regular physical activity, such as brisk walking, cycling, or other  aerobic exercises, for at least 150 minutes per week. Include strength training exercises at least twice a week. Weight Management: Achieve and maintain a healthy weight. Losing even a small amount of weight (3-5%) can significantly improve insulin sensitivity.  Plate Method Fruits & Vegetables: Aim to fill half your plate with a variety of fruits and vegetables. They are rich in vitamins, minerals, and fiber, and can help reduce the risk of chronic diseases. Choose a colorful assortment of fruits and vegetables to ensure you get a wide range of nutrients. Grains and Starches: Make at least half of your grain choices whole grains, such as brown rice, whole wheat bread, and oats. Whole grains provide fiber, which aids in digestion and healthy cholesterol levels. Aim for whole forms of starchy vegetables such as potatoes, sweet potatoes, beans, peas, and corn, which are fiber rich and provide many vitamins and minerals.  Protein: Incorporate lean sources of protein, such as poultry, fish, beans, nuts, and seeds, into your meals. Protein is essential for building and repairing tissues, staying full, balancing blood sugar, as well as supporting immune function. Dairy: Include low-fat or fat-free dairy products like milk, yogurt, and cheese in your diet. Dairy foods are excellent sources of calcium and vitamin D , which are crucial for bone health.   Handouts Provided Include (this visit) American Heart Association Healthy Sleep  Handouts Provided Include (initial assessment) Plate Method  Learning Style & Readiness for Change Teaching method utilized: Visual & Auditory  Demonstrated degree of understanding via: Teach Back  Barriers to learning/adherence to lifestyle change: none  New Goals Established by Pt  Goal 1: drink 48 oz of water a day (may add flavor).  Goal 2: incorporate a non-starchy vegetable with dinner.   Assessment of Previous Goals:   Goal 1: eat at least 1 fruit and 1  vegetable every day. - goal met, continue.  Goal 2: go walking for 20 minutes at least 3 days per week. - goal met, continue  Goal 3: have something small in the morning (examples: greek yogurt, boiled egg on toast, peanut butter on toast, etc.) - goal not met, continue.   MONITORING & EVALUATION Dietary intake, weekly physical activity, and follow up in 8 weeks.  Next Steps  Patient is to call for questions.

## 2024-04-15 DIAGNOSIS — F4312 Post-traumatic stress disorder, chronic: Secondary | ICD-10-CM | POA: Diagnosis not present

## 2024-04-22 DIAGNOSIS — F4312 Post-traumatic stress disorder, chronic: Secondary | ICD-10-CM | POA: Diagnosis not present

## 2024-04-29 DIAGNOSIS — F4312 Post-traumatic stress disorder, chronic: Secondary | ICD-10-CM | POA: Diagnosis not present

## 2024-05-13 DIAGNOSIS — F4312 Post-traumatic stress disorder, chronic: Secondary | ICD-10-CM | POA: Diagnosis not present

## 2024-05-20 DIAGNOSIS — F4312 Post-traumatic stress disorder, chronic: Secondary | ICD-10-CM | POA: Diagnosis not present

## 2024-05-27 DIAGNOSIS — F4312 Post-traumatic stress disorder, chronic: Secondary | ICD-10-CM | POA: Diagnosis not present

## 2024-06-07 ENCOUNTER — Ambulatory Visit: Admitting: Dietician

## 2024-06-10 DIAGNOSIS — F4312 Post-traumatic stress disorder, chronic: Secondary | ICD-10-CM | POA: Diagnosis not present

## 2024-06-17 ENCOUNTER — Other Ambulatory Visit

## 2024-06-17 ENCOUNTER — Encounter

## 2024-06-24 ENCOUNTER — Ambulatory Visit
Admission: RE | Admit: 2024-06-24 | Discharge: 2024-06-24 | Disposition: A | Discharge status: Home / Self Care | Source: Ambulatory Visit | Attending: Family Medicine | Admitting: Family Medicine

## 2024-06-24 DIAGNOSIS — R928 Other abnormal and inconclusive findings on diagnostic imaging of breast: Secondary | ICD-10-CM | POA: Diagnosis not present

## 2024-06-24 DIAGNOSIS — F4312 Post-traumatic stress disorder, chronic: Secondary | ICD-10-CM | POA: Diagnosis not present

## 2024-06-24 DIAGNOSIS — N6489 Other specified disorders of breast: Secondary | ICD-10-CM

## 2024-06-24 DIAGNOSIS — N6325 Unspecified lump in the left breast, overlapping quadrants: Secondary | ICD-10-CM | POA: Diagnosis not present

## 2024-07-08 DIAGNOSIS — F4312 Post-traumatic stress disorder, chronic: Secondary | ICD-10-CM | POA: Diagnosis not present

## 2024-07-15 DIAGNOSIS — F4312 Post-traumatic stress disorder, chronic: Secondary | ICD-10-CM | POA: Diagnosis not present

## 2024-07-29 ENCOUNTER — Encounter: Payer: Self-pay | Admitting: Family Medicine

## 2024-07-29 DIAGNOSIS — F4312 Post-traumatic stress disorder, chronic: Secondary | ICD-10-CM | POA: Diagnosis not present

## 2024-08-03 ENCOUNTER — Encounter: Payer: Self-pay | Admitting: Family Medicine

## 2024-08-03 ENCOUNTER — Ambulatory Visit: Admitting: Family Medicine

## 2024-08-03 ENCOUNTER — Other Ambulatory Visit (HOSPITAL_COMMUNITY): Payer: Self-pay

## 2024-08-03 VITALS — BP 122/74 | HR 88 | Temp 98.4°F | Ht 60.05 in | Wt 191.2 lb

## 2024-08-03 DIAGNOSIS — E559 Vitamin D deficiency, unspecified: Secondary | ICD-10-CM

## 2024-08-03 DIAGNOSIS — F419 Anxiety disorder, unspecified: Secondary | ICD-10-CM

## 2024-08-03 DIAGNOSIS — R7303 Prediabetes: Secondary | ICD-10-CM

## 2024-08-03 DIAGNOSIS — E66812 Obesity, class 2: Secondary | ICD-10-CM

## 2024-08-03 DIAGNOSIS — D563 Thalassemia minor: Secondary | ICD-10-CM

## 2024-08-03 MED ORDER — SERTRALINE HCL 25 MG PO TABS
ORAL_TABLET | ORAL | 0 refills | Status: AC
Start: 2024-08-03 — End: ?
  Filled 2024-08-03: qty 120, 60d supply, fill #0

## 2024-08-03 NOTE — Progress Notes (Signed)
 Established Patient Office Visit   Subjective  Patient ID: Elizabeth Benson, female    DOB: 1981-01-21  Age: 43 y.o. MRN: [id number]  Chief Complaint  Patient presents with   Medical Management of Chronic Issues    Patient came in today for Anxiety, patient would like to started Zoloft      Pt is a 43 yo female seen for ongoing concern.  Pt interested in restarting zoloft .  Just found out that her mother's cancer has returned for the 3rd time in six yrs.  Pt's mom to start chemo in a few wks and is on HD.  Pt wanting to get ahead of things before they get out of control.  In counseling weekly.  Needs to tell her kids the news.  Plans to start 2 new activities to keep busy.  Considering a drawing or painting class and a physical activity such as yoga/pilates or kick boxing.      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   Class 1 obesity due to excess calories without serious comorbidity with body mass index (BMI) of 34.0 to 34.9 in adult 11/28/2022   Vitamin D  deficiency 11/25/2021   Endometriosis 03/22/2012   Anxiety 03/22/2012   Endometriosis    Thalassemia minor 01/08/2012   Past Medical History:  Diagnosis Date   Allergy    Anxiety    no meds   Depression    no meds   Endometriosis    History of cervical dysplasia    CIN I   HSV infection    Pelvic pain in female    PONV (postoperative nausea and vomiting)    SVD (spontaneous vaginal delivery)    x 2   Thalassemia minor    Thalassemia trait    Wears glasses    Past Surgical History:  Procedure Laterality Date   ABDOMINAL HYSTERECTOMY     LAPAROSCOPIC VAGINAL HYSTERECTOMY WITH SALPINGECTOMY Bilateral 10/12/2017   Procedure: LAPAROSCOPy, VAGINAL HYSTERECTOMY WITH BILATERAL  SALPINGECTOMY;  Surgeon: Manda Fess, MD;  Location: [facility];  Service: Gynecology;  Laterality: Bilateral;  3 Hours   LAPAROSCOPY  11/03/2000   for  endometriosis   LAPAROSCOPY  08/06/2012   Procedure: LAPAROSCOPY OPERATIVE;  Surgeon: Fess Manda, MD;  Location: [facility];  Service: Gynecology;  Laterality: N/A;  Laparoscopic Pelvic Biopsies   LAPAROSCOPY N/A 09/04/2015   Procedure: LAPAROSCOPY OPERATIVE with peritoneal biopsies;  Surgeon: Fess Manda, MD;  Location: [facility];  Service: Gynecology;  Laterality: N/A;   WISDOM TOOTH EXTRACTION  07/04/1989   Social History   Tobacco Use   Smoking status: Former    Current packs/day: 0.00    Average packs/day: 0.3 packs/day for 8.0 years (2.0 ttl pk-yrs)    Types: Cigarettes    Start date: 08/09/2003    Quit date: 08/09/2011    Years since quitting: 12.9   Smokeless tobacco: Never  Vaping Use   Vaping status: Never Used  Substance Use Topics   Alcohol use: Yes    Comment: occasional    Drug use: No   Family History  Problem Relation Age of Onset   Hypertension Mother    Ovarian cancer Mother    Diabetes Mother    Arthritis Mother    Cancer Mother    Kidney disease Mother    Hypertension Father    High Cholesterol Father    Cancer Father    Hyperlipidemia Father  Hypertension Maternal Aunt    Diabetes Maternal Aunt    Hypertension Maternal Uncle    Diabetes Maternal Uncle    Asthma Maternal Uncle    Breast cancer Paternal Aunt    Diabetes Paternal Aunt    Hypertension Paternal Aunt    Cancer Paternal Aunt        breast   Hyperlipidemia Paternal Aunt    Diabetes Paternal Uncle    Hypertension Paternal Uncle    Stroke Paternal Uncle    Alcohol abuse Paternal Uncle    Cancer Paternal Uncle    Hypertension Maternal Grandmother    Heart disease Maternal Grandmother    Miscarriages / Stillbirths Maternal Grandmother    Cancer Maternal Grandfather        prostate   Hypertension Maternal Grandfather    Stroke Maternal Grandfather    Diabetes Paternal Grandmother    Miscarriages / Stillbirths Paternal Grandmother    Stroke Paternal  Grandfather    Hypertension Paternal Grandfather    Stroke Paternal Uncle    Varicose Veins Paternal Uncle    Allergies  Allergen Reactions   Other     ROS Negative unless stated above    Objective:     BP 122/74 (BP Location: Left Arm, Patient Position: Sitting, Cuff Size: Large)   Pulse 88   Temp 98.4 F (36.9 C) (Oral)   Ht 5' 0.05 (1.525 m)   Wt 191 lb 3.2 oz (86.7 kg)   LMP  (LMP Unknown)   SpO2 98%   BMI 37.28 kg/m  BP Readings from Last 3 Encounters:  08/03/24 122/74  12/25/23 (!) 146/74  03/27/23 102/76   Wt Readings from Last 3 Encounters:  08/03/24 191 lb 3.2 oz (86.7 kg)  04/07/24 193 lb 9.6 oz (87.8 kg)  02/10/24 192 lb 11.2 oz (87.4 kg)      Physical Exam Constitutional:      Appearance: Normal appearance.  HENT:     Head: Normocephalic and atraumatic.     Nose: Nose normal.     Mouth/Throat:     Mouth: Mucous membranes are moist.  Cardiovascular:     Rate and Rhythm: Normal rate.  Pulmonary:     Effort: Pulmonary effort is normal.  Skin:    General: Skin is warm and dry.  Neurological:     Mental Status: She is alert and oriented to person, place, and time.        08/03/2024    9:51 AM 02/10/2024    8:05 AM 12/25/2023    4:23 PM  Depression screen PHQ 2/9  Decreased Interest 1 0 1  Down, Depressed, Hopeless 2 0 0  PHQ - 2 Score 3 0 1  Altered sleeping 3  0  Tired, decreased energy 2  1  Change in appetite 2  0  Feeling bad or failure about yourself  0  0  Trouble concentrating 1  0  Moving slowly or fidgety/restless 0  0  Suicidal thoughts 0  0  PHQ-9 Score 11  2  Difficult doing work/chores Very difficult  Not difficult at all      08/03/2024    9:52 AM 12/25/2023    4:23 PM 03/27/2023   11:30 AM 11/28/2022   11:57 AM  GAD 7 : Generalized Anxiety Score  Nervous, Anxious, on Edge 2 0 0 0  Control/stop worrying 3 0 0 0  Worry too much - different things 2 0 0 0  Trouble relaxing 1 0 0 0  Restless  1 0 0 0  Easily annoyed or  irritable 2 0 0 1  Afraid - awful might happen 3 0 0 0  Total GAD 7 Score 14 0 0 1  Anxiety Difficulty Very difficult Not difficult at all  Not difficult at all     No results found for any visits on 08/03/24.    Assessment & Plan:   Anxiety -     Sertraline  HCl; Take 1 tab (25 mg) daily.  Can increase to 2 tabs (50 mg) daily after two weeks.  Dispense: 120 tablet; Refill: 0  PHQ-9 score 11 and GAD 7 score 14 this visit.  Start Zoloft  25 mg daily.  Can increase dose to 50 mg in 1-2 wks if needed.  Continue weekly counseling.  Pt encouraged to start 2 new activities.  Given precautions.  F/u in 4-6 wks, sooner if needed.    Return in about 5 weeks (around 09/07/2024).   Clotilda JONELLE Single, MD

## 2024-08-16 ENCOUNTER — Other Ambulatory Visit (HOSPITAL_COMMUNITY): Payer: Self-pay

## 2024-08-19 DIAGNOSIS — F4312 Post-traumatic stress disorder, chronic: Secondary | ICD-10-CM | POA: Diagnosis not present

## 2024-08-22 ENCOUNTER — Other Ambulatory Visit: Payer: Self-pay | Admitting: Medical Genetics

## 2024-08-22 DIAGNOSIS — Z006 Encounter for examination for normal comparison and control in clinical research program: Secondary | ICD-10-CM

## 2024-08-26 DIAGNOSIS — F4312 Post-traumatic stress disorder, chronic: Secondary | ICD-10-CM | POA: Diagnosis not present

## 2024-09-02 DIAGNOSIS — F4312 Post-traumatic stress disorder, chronic: Secondary | ICD-10-CM | POA: Diagnosis not present

## 2024-09-09 DIAGNOSIS — F4312 Post-traumatic stress disorder, chronic: Secondary | ICD-10-CM | POA: Diagnosis not present

## 2024-09-15 ENCOUNTER — Other Ambulatory Visit (HOSPITAL_COMMUNITY): Payer: Self-pay

## 2024-09-16 DIAGNOSIS — F4312 Post-traumatic stress disorder, chronic: Secondary | ICD-10-CM | POA: Diagnosis not present

## 2024-10-07 DIAGNOSIS — F4312 Post-traumatic stress disorder, chronic: Secondary | ICD-10-CM | POA: Diagnosis not present

## 2024-10-08 ENCOUNTER — Other Ambulatory Visit: Payer: Self-pay | Admitting: Family Medicine

## 2024-10-08 DIAGNOSIS — F419 Anxiety disorder, unspecified: Secondary | ICD-10-CM

## 2024-10-13 ENCOUNTER — Other Ambulatory Visit (HOSPITAL_COMMUNITY): Payer: Self-pay

## 2024-10-13 ENCOUNTER — Ambulatory Visit: Admitting: Family Medicine

## 2024-10-13 ENCOUNTER — Other Ambulatory Visit: Payer: Self-pay

## 2024-10-13 ENCOUNTER — Encounter: Payer: Self-pay | Admitting: Family Medicine

## 2024-10-13 VITALS — BP 120/80 | HR 74 | Temp 98.0°F | Ht 60.5 in | Wt 187.0 lb

## 2024-10-13 DIAGNOSIS — F419 Anxiety disorder, unspecified: Secondary | ICD-10-CM

## 2024-10-13 DIAGNOSIS — R7303 Prediabetes: Secondary | ICD-10-CM

## 2024-10-13 DIAGNOSIS — Z6837 Body mass index (BMI) 37.0-37.9, adult: Secondary | ICD-10-CM

## 2024-10-13 DIAGNOSIS — E66812 Obesity, class 2: Secondary | ICD-10-CM | POA: Diagnosis not present

## 2024-10-13 DIAGNOSIS — G4700 Insomnia, unspecified: Secondary | ICD-10-CM

## 2024-10-13 MED ORDER — SERTRALINE HCL 25 MG PO TABS
25.0000 mg | ORAL_TABLET | Freq: Every day | ORAL | 3 refills | Status: AC
  Filled 2024-10-13 (×2): qty 90, 90d supply, fill #0

## 2024-10-13 MED ORDER — ZOLPIDEM TARTRATE 5 MG PO TABS
5.0000 mg | ORAL_TABLET | Freq: Every evening | ORAL | 1 refills | Status: DC | PRN
  Filled 2024-10-13 (×2): qty 15, 15d supply, fill #0
  Filled 2024-11-01: qty 15, 15d supply, fill #1

## 2024-10-13 NOTE — Progress Notes (Signed)
 "  Established Patient Office Visit   Subjective  Patient ID: Elizabeth Benson, female    DOB: [birth year]  Age: 43 y.o. MRN: [id number]  Chief Complaint  Patient presents with   Acute Visit    Patient wants to discuss weight loss options.    Pt is a 43 yo female seen for f/u.  Pt interested in other wt loss options.  Previously on meds but did not notice as much wt loss as desired.  Feels like more accountability would be helpful.  Pt notes insomnia when her kids are home.  Has no trouble sleeping when they are away.  In the past hydralazine made her feel like things were crawling on her.  Pt states she is doing well on Zoloft  25 mg.  Restarted at last [facility] due to increased anxiety/PTSD regarding her mom's health as she was recently dx'd with recurrence of cancer.     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   Class 1 obesity due to excess calories without serious comorbidity with body mass index (BMI) of 34.0 to 34.9 in adult 11/28/2022   Vitamin D  deficiency 11/25/2021   Endometriosis 03/22/2012   Anxiety 03/22/2012   Endometriosis    Thalassemia minor 01/08/2012   Past Medical History:  Diagnosis Date   Allergy    Anxiety    no meds   Depression    no meds   Endometriosis    History of cervical dysplasia    CIN I   HSV infection    Pelvic pain in female    PONV (postoperative nausea and vomiting)    SVD (spontaneous vaginal delivery)    x 2   Thalassemia minor    Thalassemia trait    Wears glasses    Past Surgical History:  Procedure Laterality Date   ABDOMINAL HYSTERECTOMY     LAPAROSCOPIC VAGINAL HYSTERECTOMY WITH SALPINGECTOMY Bilateral 10/12/2017   Procedure: LAPAROSCOPy, VAGINAL HYSTERECTOMY WITH BILATERAL  SALPINGECTOMY;  Surgeon: Manda Fess, MD;  Location: [facility];  Service: Gynecology;  Laterality: Bilateral;  3 Hours   LAPAROSCOPY  11/03/2000   for endometriosis    LAPAROSCOPY  08/06/2012   Procedure: LAPAROSCOPY OPERATIVE;  Surgeon: Fess Manda, MD;  Location: [facility];  Service: Gynecology;  Laterality: N/A;  Laparoscopic Pelvic Biopsies   LAPAROSCOPY N/A 09/04/2015   Procedure: LAPAROSCOPY OPERATIVE with peritoneal biopsies;  Surgeon: Fess Manda, MD;  Location: [facility];  Service: Gynecology;  Laterality: N/A;   WISDOM TOOTH EXTRACTION  07/04/1989   Social History[1] Family History  Problem Relation Age of Onset   Hypertension Mother    Ovarian cancer Mother    Diabetes Mother    Arthritis Mother    Cancer Mother    Kidney disease Mother    Hypertension Father    High Cholesterol Father    Cancer Father    Hyperlipidemia Father    Hypertension Maternal Aunt    Diabetes Maternal Aunt    Hypertension Maternal Uncle    Diabetes Maternal Uncle    Asthma Maternal Uncle    Breast cancer Paternal Aunt    Diabetes Paternal Aunt    Hypertension Paternal Aunt    Cancer Paternal Aunt        breast   Hyperlipidemia Paternal Aunt    Diabetes Paternal Uncle    Hypertension Paternal Uncle    Stroke Paternal Uncle    Alcohol abuse Paternal Uncle  Cancer Paternal Uncle    Hypertension Maternal Grandmother    Heart disease Maternal Grandmother    Miscarriages / Stillbirths Maternal Grandmother    Cancer Maternal Grandfather        prostate   Hypertension Maternal Grandfather    Stroke Maternal Grandfather    Diabetes Paternal Grandmother    Miscarriages / Stillbirths Paternal Grandmother    Stroke Paternal Grandfather    Hypertension Paternal Grandfather    Stroke Paternal Uncle    Varicose Veins Paternal Uncle    Allergies[2]  ROS Negative unless stated above    Objective:     BP 120/80 (BP Location: Left Arm, Patient Position: Sitting, Cuff Size: Large)   Pulse 74   Temp 98 F (36.7 C) (Oral)   Ht 5' 0.5 (1.537 m)   Wt 187 lb (84.8 kg)   LMP  (LMP Unknown)   SpO2 99%   BMI 35.92 kg/m  BP  Readings from Last 3 Encounters:  10/13/24 120/80  08/03/24 122/74  12/25/23 (!) 146/74   Wt Readings from Last 3 Encounters:  10/13/24 187 lb (84.8 kg)  08/03/24 191 lb 3.2 oz (86.7 kg)  04/07/24 193 lb 9.6 oz (87.8 kg)      Physical Exam Constitutional:      General: She is not in acute distress.    Appearance: Normal appearance.  HENT:     Head: Normocephalic and atraumatic.     Nose: Nose normal.     Mouth/Throat:     Mouth: Mucous membranes are moist.  Cardiovascular:     Rate and Rhythm: Normal rate and regular rhythm.     Heart sounds: Normal heart sounds. No murmur heard.    No gallop.  Pulmonary:     Effort: Pulmonary effort is normal. No respiratory distress.     Breath sounds: Normal breath sounds. No wheezing, rhonchi or rales.  Skin:    General: Skin is warm and dry.  Neurological:     Mental Status: She is alert and oriented to person, place, and time.        08/03/2024    9:51 AM 02/10/2024    8:05 AM 12/25/2023    4:23 PM  Depression screen PHQ 2/9  Decreased Interest 1 0 1  Down, Depressed, Hopeless 2 0 0  PHQ - 2 Score 3 0 1  Altered sleeping 3  0  Tired, decreased energy 2  1  Change in appetite 2  0  Feeling bad or failure about yourself  0  0  Trouble concentrating 1  0  Moving slowly or fidgety/restless 0  0  Suicidal thoughts 0  0  PHQ-9 Score 11   2   Difficult doing work/chores Very difficult  Not difficult at all     Data saved with a previous flowsheet row definition      08/03/2024    9:52 AM 12/25/2023    4:23 PM 03/27/2023   11:30 AM 11/28/2022   11:57 AM  GAD 7 : Generalized Anxiety Score  Nervous, Anxious, on Edge 2 0 0 0  Control/stop worrying 3 0 0 0  Worry too much - different things 2 0 0 0  Trouble relaxing 1 0 0 0  Restless 1 0 0 0  Easily annoyed or irritable 2 0 0 1  Afraid - awful might happen 3 0 0 0  Total GAD 7 Score 14 0 0 1  Anxiety Difficulty Very difficult Not difficult at all  Not difficult at  all      No results found for any visits on 10/13/24.    Assessment & Plan:   Insomnia, unspecified type -     Zolpidem  Tartrate; Take 1 tablet (5 mg total) by mouth at bedtime as needed for sleep.  Dispense: 15 tablet; Refill: 1  Class 2 severe obesity with serious comorbidity and body mass index (BMI) of 37.0 to 37.9 in adult, unspecified obesity type -     Amb Ref to Medical Weight Management  Anxiety -     Sertraline  HCl; Take 1 tablet (25 mg total) by mouth daily.  Dispense: 90 tablet; Refill: 3  Prediabetes  Anxiety/PTSD stable. Continue Zoloft  25 mg daily and counseling.  Intermittent insomnia when her kids ar home.  No issues with sleep otherwise.  Ambien  prn.  Given precautions.  Discussed r/b/a.  Body mass index is 35.92 kg/m.  Difficulty losing/maintaining wt loss. Previously on Zepbound  and saxenda .  Continue lifestyle modifications including diet and exercise.  Will help with predDM, Hgb A1C 5.9% on 01/01/24.  Referral to wt management placed.     Return in about 3 months (around 01/11/2025).   Clotilda JONELLE Single, MD     [1]  Social History Tobacco Use   Smoking status: Former    Current packs/day: 0.00    Average packs/day: 0.3 packs/day for 8.0 years (2.0 ttl pk-yrs)    Types: Cigarettes    Start date: 08/09/2003    Quit date: 08/09/2011    Years since quitting: 13.1   Smokeless tobacco: Never  Vaping Use   Vaping status: Never Used  Substance Use Topics   Alcohol use: Yes    Comment: occasional    Drug use: No  [2]  Allergies Allergen Reactions   Other    "

## 2024-10-14 ENCOUNTER — Other Ambulatory Visit (HOSPITAL_COMMUNITY): Payer: Self-pay

## 2024-10-14 DIAGNOSIS — F4312 Post-traumatic stress disorder, chronic: Secondary | ICD-10-CM | POA: Diagnosis not present

## 2024-10-21 DIAGNOSIS — F4312 Post-traumatic stress disorder, chronic: Secondary | ICD-10-CM | POA: Diagnosis not present

## 2024-10-26 ENCOUNTER — Ambulatory Visit: Admitting: Nurse Practitioner

## 2024-10-26 ENCOUNTER — Encounter: Payer: Self-pay | Admitting: Nurse Practitioner

## 2024-10-26 VITALS — BP 112/77 | HR 79 | Temp 98.1°F | Ht 60.5 in | Wt 179.0 lb

## 2024-10-26 DIAGNOSIS — E559 Vitamin D deficiency, unspecified: Secondary | ICD-10-CM

## 2024-10-26 DIAGNOSIS — R7303 Prediabetes: Secondary | ICD-10-CM | POA: Diagnosis not present

## 2024-10-26 DIAGNOSIS — Z6834 Body mass index (BMI) 34.0-34.9, adult: Secondary | ICD-10-CM

## 2024-10-26 DIAGNOSIS — E66811 Obesity, class 1: Secondary | ICD-10-CM

## 2024-10-26 NOTE — Progress Notes (Signed)
 " Office: [phone]  /  Fax: [phone]   Initial Visit  Elizabeth Benson was seen in clinic today to evaluate for obesity. She is interested in losing weight to improve overall health and reduce the risk of weight related complications. She presents today to review program treatment options, initial physical assessment, and evaluation.     She was referred by: PCP  When asked what else they would like to accomplish? She states: Adopt a healthier eating pattern and lifestyle, Improve energy levels and physical activity, Improve existing medical conditions, and Improve quality of life   When asked how has your weight affected you? She states: Having fatigue and Having poor endurance  Some associated conditions: anxiety, prediabetes, vit D def, thalassemia minor, HLD  Contributing factors: family history of obesity, consumption of processed foods, reduced physical activity, chronic skipping of meals, menopause, and sedentary job  Weight promoting medications identified: None  Current nutrition plan: None  Current level of physical activity: None  Current or previous pharmacotherapy: GLP-1-Zepbound  and Saxenda  (constipation and GI upset)  Response to medication: Lost weight initially but was unable to sustain weight loss   Past medical history includes:   Past Medical History:  Diagnosis Date   Allergy    Anxiety    no meds   Depression    no meds   Endometriosis    History of cervical dysplasia    CIN I   HSV infection    Pelvic pain in female    PONV (postoperative nausea and vomiting)    SVD (spontaneous vaginal delivery)    x 2   Thalassemia minor    Thalassemia trait    Wears glasses      Objective:   BP 112/77   Pulse 79   Temp 98.1 F (36.7 C)   Ht 5' 0.5 (1.537 m)   Wt 179 lb (81.2 kg)   LMP  (LMP Unknown)   SpO2 100%   BMI 34.38 kg/m  She was weighed on the bioimpedance scale: Body mass index is 34.38 kg/m.  Peak Weight:1960lbs ,  Body Fat%:43.9%, Visceral Fat Rating:10, Weight trend over the last 12 months: Decreasing  General:  Alert, oriented and cooperative. Patient is in no acute distress.  Respiratory: Normal respiratory effort, no problems with respiration noted   Gait: able to ambulate independently  Mental Status: Normal mood and affect. Normal behavior. Normal judgment and thought content.   DIAGNOSTIC DATA REVIEWED:  BMET    Component Value Date/Time   NA 136 01/01/2024 1131   K 4.2 01/01/2024 1131   CL 104 01/01/2024 1131   CO2 26 01/01/2024 1131   GLUCOSE 81 01/01/2024 1131   BUN 7 01/01/2024 1131   CREATININE 0.80 01/01/2024 1131   CREATININE 0.86 12/29/2012 1548   CALCIUM 9.0 01/01/2024 1131   GFRNONAA >60 08/24/2016 0938   GFRAA >60 08/24/2016 0938   Lab Results  Component Value Date   HGBA1C 5.9 01/01/2024   HGBA1C 5.8 03/10/2018   No results found for: INSULIN CBC    Component Value Date/Time   WBC 5.9 01/01/2024 1131   RBC 4.97 01/01/2024 1131   HGB 11.3 (L) 01/01/2024 1131   HGB 11.8 04/13/2012 0815   HCT 35.4 (L) 01/01/2024 1131   HCT 36.1 04/13/2012 0815   PLT 340.0 01/01/2024 1131   PLT 263 04/13/2012 0815   MCV 71.1 (L) 01/01/2024 1131   MCV 71.3 (L) 04/13/2012 0815   MCH 22.6 (L) 10/12/2017 2340   MCHC  32.0 01/01/2024 1131   RDW 16.8 (H) 01/01/2024 1131   RDW 17.0 (H) 04/13/2012 0815   Iron/TIBC/Ferritin/ %Sat    Component Value Date/Time   IRON 77 12/29/2012 1548   TIBC 355 12/29/2012 1548   FERRITIN 35 12/29/2012 1548   IRONPCTSAT 22 12/29/2012 1548   Lipid Panel     Component Value Date/Time   CHOL 173 01/01/2024 1131   TRIG 78.0 01/01/2024 1131   HDL 40.20 01/01/2024 1131   CHOLHDL 4 01/01/2024 1131   VLDL 15.6 01/01/2024 1131   LDLCALC 117 (H) 01/01/2024 1131   Hepatic Function Panel     Component Value Date/Time   PROT 7.7 01/01/2024 1131   ALBUMIN 4.2 01/01/2024 1131   AST 11 01/01/2024 1131   ALT 11 01/01/2024 1131   ALKPHOS 58  01/01/2024 1131   BILITOT 0.7 01/01/2024 1131      Component Value Date/Time   TSH 1.26 01/01/2024 1131     Assessment and Plan:   Prediabetes Last A1c was 5.9 on 01/01/24.  Will obtain fasting labs at next visit  Vitamin D  deficiency Last Vit D was 15.25 on 01/01/24.  Will obtain labs at next visit  Obesity, Class I, BMI 30-34.9        Obesity Treatment / Action Plan:  Patient will work on garnering support from family and friends to begin weight loss journey. Will work on eliminating or reducing the presence of highly palatable, calorie dense foods in the home. Will complete provided nutritional and psychosocial assessment questionnaire before the next appointment. Will be scheduled for indirect calorimetry to determine resting energy expenditure in a fasting state.  This will allow us  to create a reduced calorie, high-protein meal plan to promote loss of fat mass while preserving muscle mass. Counseled on the health benefits of losing 5%-15% of total body weight. Was counseled on nutritional approaches to weight loss and benefits of reducing processed foods and consuming plant-based foods and high quality protein as part of nutritional weight management. Was counseled on pharmacotherapy and role as an adjunct in weight management.   Obesity Education Performed Today:  She was weighed on the bioimpedance scale and results were discussed and documented in the synopsis.  We discussed obesity as a disease and the importance of a more detailed evaluation of all the factors contributing to the disease.  We discussed the importance of long term lifestyle changes which include nutrition, exercise and behavioral modifications as well as the importance of customizing this to her specific health and social needs.  We discussed the benefits of reaching a healthier weight to alleviate the symptoms of existing conditions and reduce the risks of the biomechanical, metabolic and  psychological effects of obesity.  Elizabeth Benson appears to be in the action stage of change and states they are ready to start intensive lifestyle modifications and behavioral modifications.  30 minutes was spent today on this visit including the above counseling, pre-visit chart review, and post-visit documentation.  Reviewed by clinician on day of visit: allergies, medications, problem list, medical history, surgical history, family history, social history, and previous encounter notes pertinent to obesity diagnosis.    Elizabeth SAUNDERS Lilliemae Fruge FNP-C   "

## 2024-11-02 ENCOUNTER — Other Ambulatory Visit: Payer: Self-pay

## 2024-11-07 ENCOUNTER — Other Ambulatory Visit: Payer: Self-pay | Admitting: Family Medicine

## 2024-11-07 DIAGNOSIS — Z1231 Encounter for screening mammogram for malignant neoplasm of breast: Secondary | ICD-10-CM

## 2024-11-18 ENCOUNTER — Other Ambulatory Visit (HOSPITAL_COMMUNITY): Payer: Self-pay

## 2024-11-18 ENCOUNTER — Other Ambulatory Visit: Payer: Self-pay | Admitting: Family Medicine

## 2024-11-18 ENCOUNTER — Other Ambulatory Visit: Payer: Self-pay

## 2024-11-18 DIAGNOSIS — G4700 Insomnia, unspecified: Secondary | ICD-10-CM

## 2024-11-18 MED ORDER — ZOLPIDEM TARTRATE 5 MG PO TABS
5.0000 mg | ORAL_TABLET | Freq: Every evening | ORAL | 0 refills | Status: AC | PRN
  Filled 2024-11-18: qty 30, 30d supply, fill #0

## 2024-11-29 ENCOUNTER — Ambulatory Visit: Admitting: Bariatrics

## 2024-12-02 ENCOUNTER — Encounter

## 2024-12-02 DIAGNOSIS — Z1231 Encounter for screening mammogram for malignant neoplasm of breast: Secondary | ICD-10-CM

## 2024-12-05 ENCOUNTER — Ambulatory Visit

## 2024-12-05 DIAGNOSIS — Z1231 Encounter for screening mammogram for malignant neoplasm of breast: Secondary | ICD-10-CM

## 2024-12-07 ENCOUNTER — Other Ambulatory Visit: Payer: Self-pay | Admitting: Family Medicine

## 2024-12-07 DIAGNOSIS — Z1231 Encounter for screening mammogram for malignant neoplasm of breast: Secondary | ICD-10-CM

## 2024-12-09 ENCOUNTER — Inpatient Hospital Stay: Admission: RE | Admit: 2024-12-09

## 2024-12-09 DIAGNOSIS — Z1231 Encounter for screening mammogram for malignant neoplasm of breast: Secondary | ICD-10-CM

## 2024-12-13 ENCOUNTER — Ambulatory Visit: Admitting: Bariatrics

## 2024-12-28 ENCOUNTER — Ambulatory Visit: Admitting: Bariatrics

## 2024-12-30 ENCOUNTER — Encounter: Admitting: Family Medicine

## 2025-01-19 ENCOUNTER — Ambulatory Visit: Admitting: Bariatrics
